# Patient Record
Sex: Male | Born: 1970 | Race: White | Hispanic: No | Marital: Single | Smoking: Current every day smoker
Health system: Southern US, Community
[De-identification: ages and names within clinical notes are randomized; demographics above are authoritative.]

## PROBLEM LIST (undated history)

## (undated) DIAGNOSIS — I4891 Unspecified atrial fibrillation: Secondary | ICD-10-CM

## (undated) DIAGNOSIS — R7303 Prediabetes: Secondary | ICD-10-CM

## (undated) DIAGNOSIS — J449 Chronic obstructive pulmonary disease, unspecified: Secondary | ICD-10-CM

## (undated) DIAGNOSIS — I10 Essential (primary) hypertension: Secondary | ICD-10-CM

## (undated) HISTORY — DX: Essential (primary) hypertension: I10

## (undated) HISTORY — DX: Prediabetes: R73.03

## (undated) HISTORY — PX: THUMB FUSION: SUR636

---

## 1998-02-17 ENCOUNTER — Emergency Department (HOSPITAL_COMMUNITY): Admission: EM | Admit: 1998-02-17 | Discharge: 1998-02-17 | Payer: Self-pay | Admitting: Emergency Medicine

## 1998-02-17 ENCOUNTER — Encounter: Payer: Self-pay | Admitting: Emergency Medicine

## 2003-12-14 ENCOUNTER — Emergency Department (HOSPITAL_COMMUNITY): Admission: EM | Admit: 2003-12-14 | Discharge: 2003-12-14 | Payer: Self-pay | Admitting: Emergency Medicine

## 2004-06-04 ENCOUNTER — Emergency Department (HOSPITAL_COMMUNITY): Admission: EM | Admit: 2004-06-04 | Discharge: 2004-06-04 | Payer: Self-pay

## 2004-06-13 ENCOUNTER — Emergency Department (HOSPITAL_COMMUNITY): Admission: EM | Admit: 2004-06-13 | Discharge: 2004-06-13 | Payer: Self-pay | Admitting: Family Medicine

## 2004-08-22 ENCOUNTER — Emergency Department (HOSPITAL_COMMUNITY): Admission: EM | Admit: 2004-08-22 | Discharge: 2004-08-22 | Payer: Self-pay | Admitting: Family Medicine

## 2005-08-31 ENCOUNTER — Ambulatory Visit: Payer: Self-pay | Admitting: Cardiology

## 2005-08-31 ENCOUNTER — Observation Stay (HOSPITAL_COMMUNITY): Admission: EM | Admit: 2005-08-31 | Discharge: 2005-09-01 | Payer: Self-pay | Admitting: Emergency Medicine

## 2006-06-20 ENCOUNTER — Emergency Department (HOSPITAL_COMMUNITY): Admission: EM | Admit: 2006-06-20 | Discharge: 2006-06-20 | Payer: Self-pay | Admitting: Emergency Medicine

## 2007-02-27 ENCOUNTER — Emergency Department (HOSPITAL_COMMUNITY): Admission: EM | Admit: 2007-02-27 | Discharge: 2007-02-27 | Payer: Self-pay | Admitting: Emergency Medicine

## 2007-08-15 ENCOUNTER — Emergency Department (HOSPITAL_COMMUNITY): Admission: EM | Admit: 2007-08-15 | Discharge: 2007-08-15 | Payer: Self-pay | Admitting: Family Medicine

## 2007-08-17 ENCOUNTER — Emergency Department (HOSPITAL_COMMUNITY): Admission: EM | Admit: 2007-08-17 | Discharge: 2007-08-17 | Payer: Self-pay | Admitting: Family Medicine

## 2007-09-19 ENCOUNTER — Emergency Department (HOSPITAL_COMMUNITY): Admission: EM | Admit: 2007-09-19 | Discharge: 2007-09-19 | Payer: Self-pay | Admitting: Emergency Medicine

## 2008-01-02 ENCOUNTER — Emergency Department (HOSPITAL_COMMUNITY): Admission: EM | Admit: 2008-01-02 | Discharge: 2008-01-02 | Payer: Self-pay | Admitting: Family Medicine

## 2008-11-20 ENCOUNTER — Emergency Department (HOSPITAL_COMMUNITY): Admission: EM | Admit: 2008-11-20 | Discharge: 2008-11-20 | Payer: Self-pay | Admitting: Emergency Medicine

## 2008-11-20 ENCOUNTER — Emergency Department (HOSPITAL_COMMUNITY): Admission: EM | Admit: 2008-11-20 | Discharge: 2008-11-20 | Payer: Self-pay | Admitting: Family Medicine

## 2008-11-27 ENCOUNTER — Emergency Department (HOSPITAL_COMMUNITY): Admission: EM | Admit: 2008-11-27 | Discharge: 2008-11-27 | Payer: Self-pay | Admitting: Emergency Medicine

## 2009-09-27 ENCOUNTER — Emergency Department (HOSPITAL_COMMUNITY): Admission: EM | Admit: 2009-09-27 | Discharge: 2009-09-27 | Payer: Self-pay | Admitting: Emergency Medicine

## 2009-09-30 ENCOUNTER — Encounter: Admission: RE | Admit: 2009-09-30 | Discharge: 2009-11-03 | Payer: Self-pay | Admitting: Orthopedic Surgery

## 2009-10-16 ENCOUNTER — Emergency Department (HOSPITAL_COMMUNITY): Admission: EM | Admit: 2009-10-16 | Discharge: 2009-10-16 | Payer: Self-pay | Admitting: Emergency Medicine

## 2009-11-15 ENCOUNTER — Emergency Department (HOSPITAL_COMMUNITY): Admission: EM | Admit: 2009-11-15 | Discharge: 2009-11-15 | Payer: Self-pay | Admitting: Emergency Medicine

## 2009-11-24 ENCOUNTER — Emergency Department (HOSPITAL_COMMUNITY): Admission: EM | Admit: 2009-11-24 | Discharge: 2009-11-24 | Payer: Self-pay | Admitting: Family Medicine

## 2010-05-31 IMAGING — CR DG TIBIA/FIBULA 2V*L*
4 series · 4 of 4 positions shown · non-contrast
Comparison: None

CLINICAL DATA: Erythema, swelling and tenderness of the lower leg.

LEFT TIBIA AND FIBULA - 2 VIEW

[t tib/fib ap left (1 of 2)]
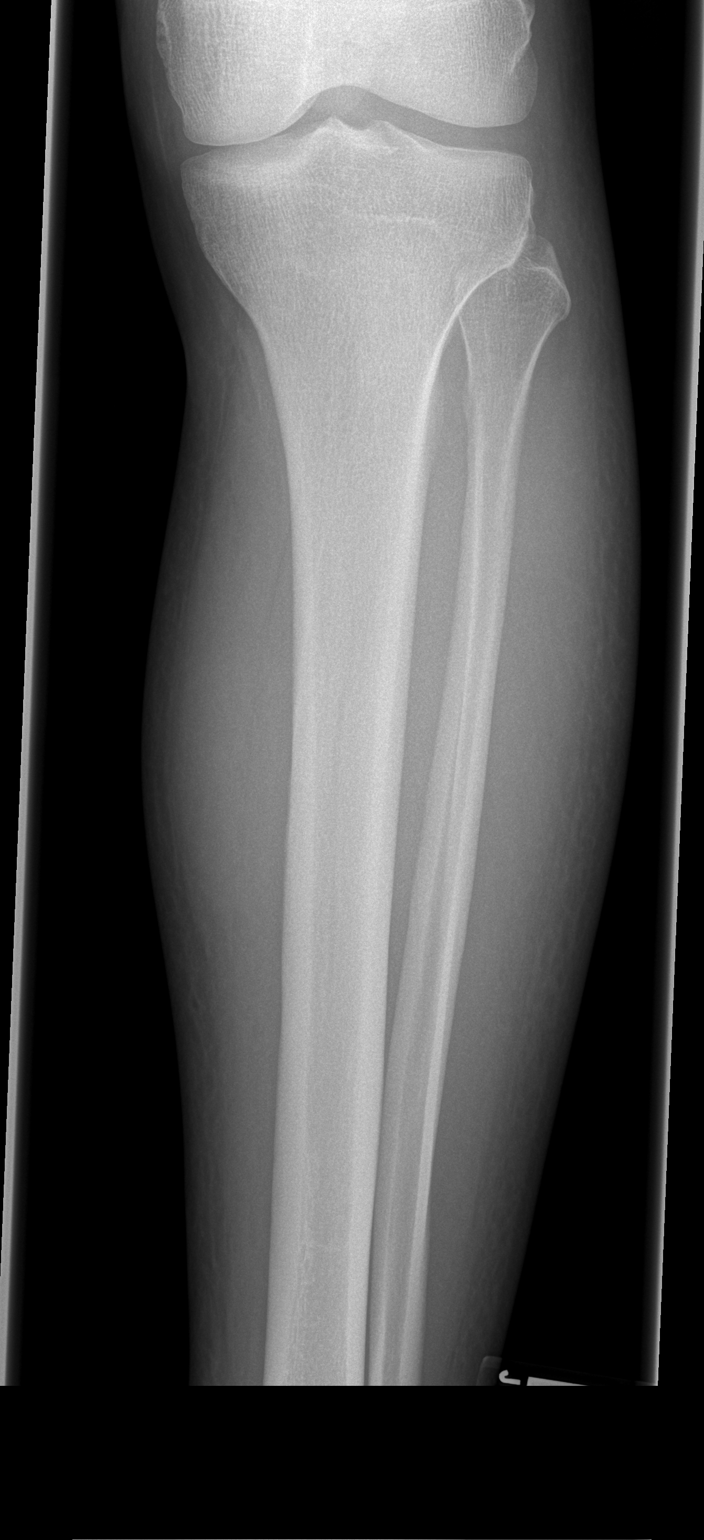

[t tib/fib ap left (2 of 2)]
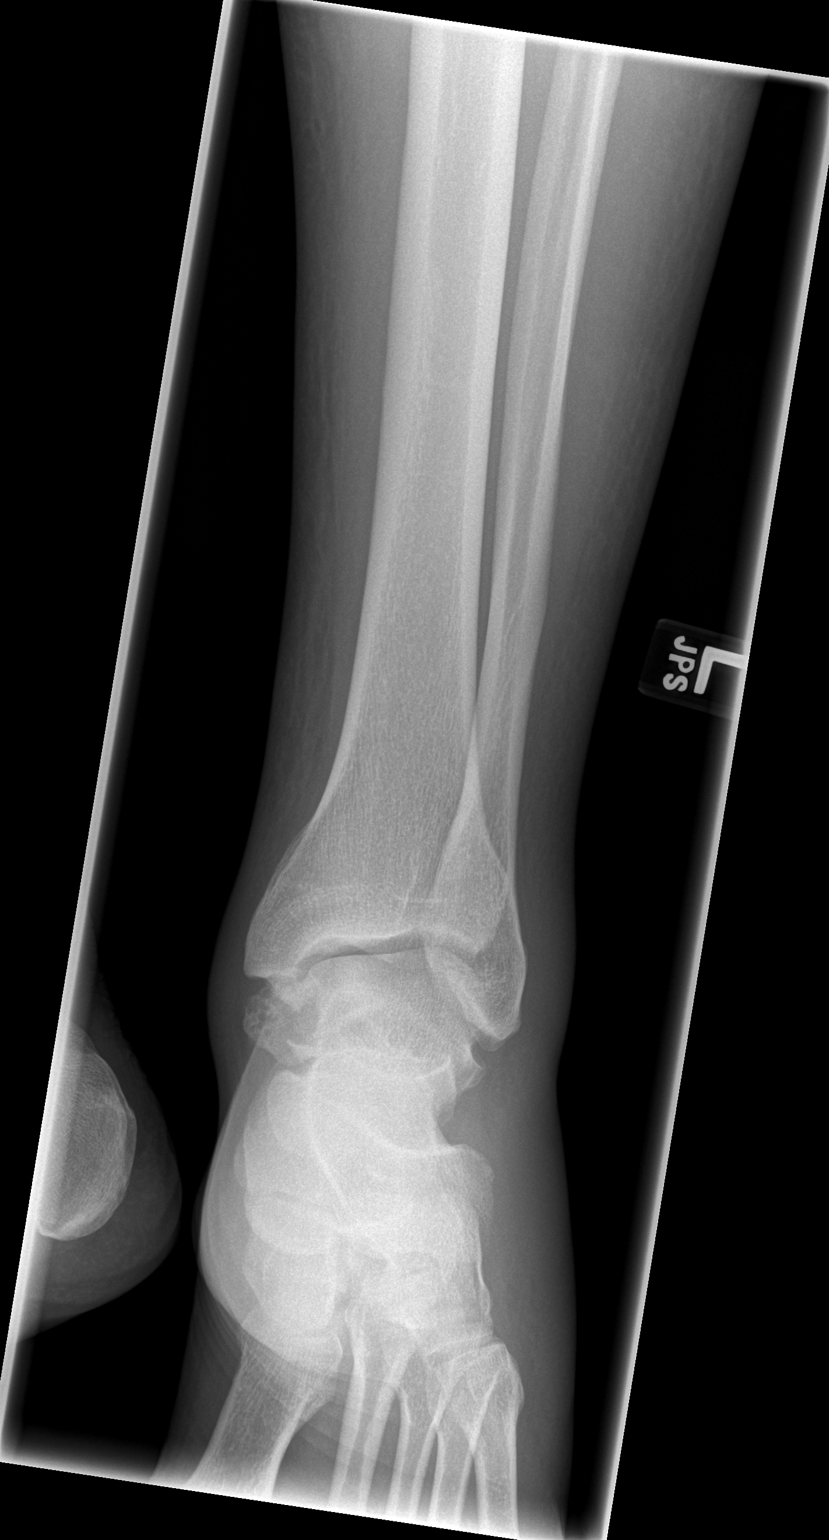

[t tib/fib lat left (1 of 2)]
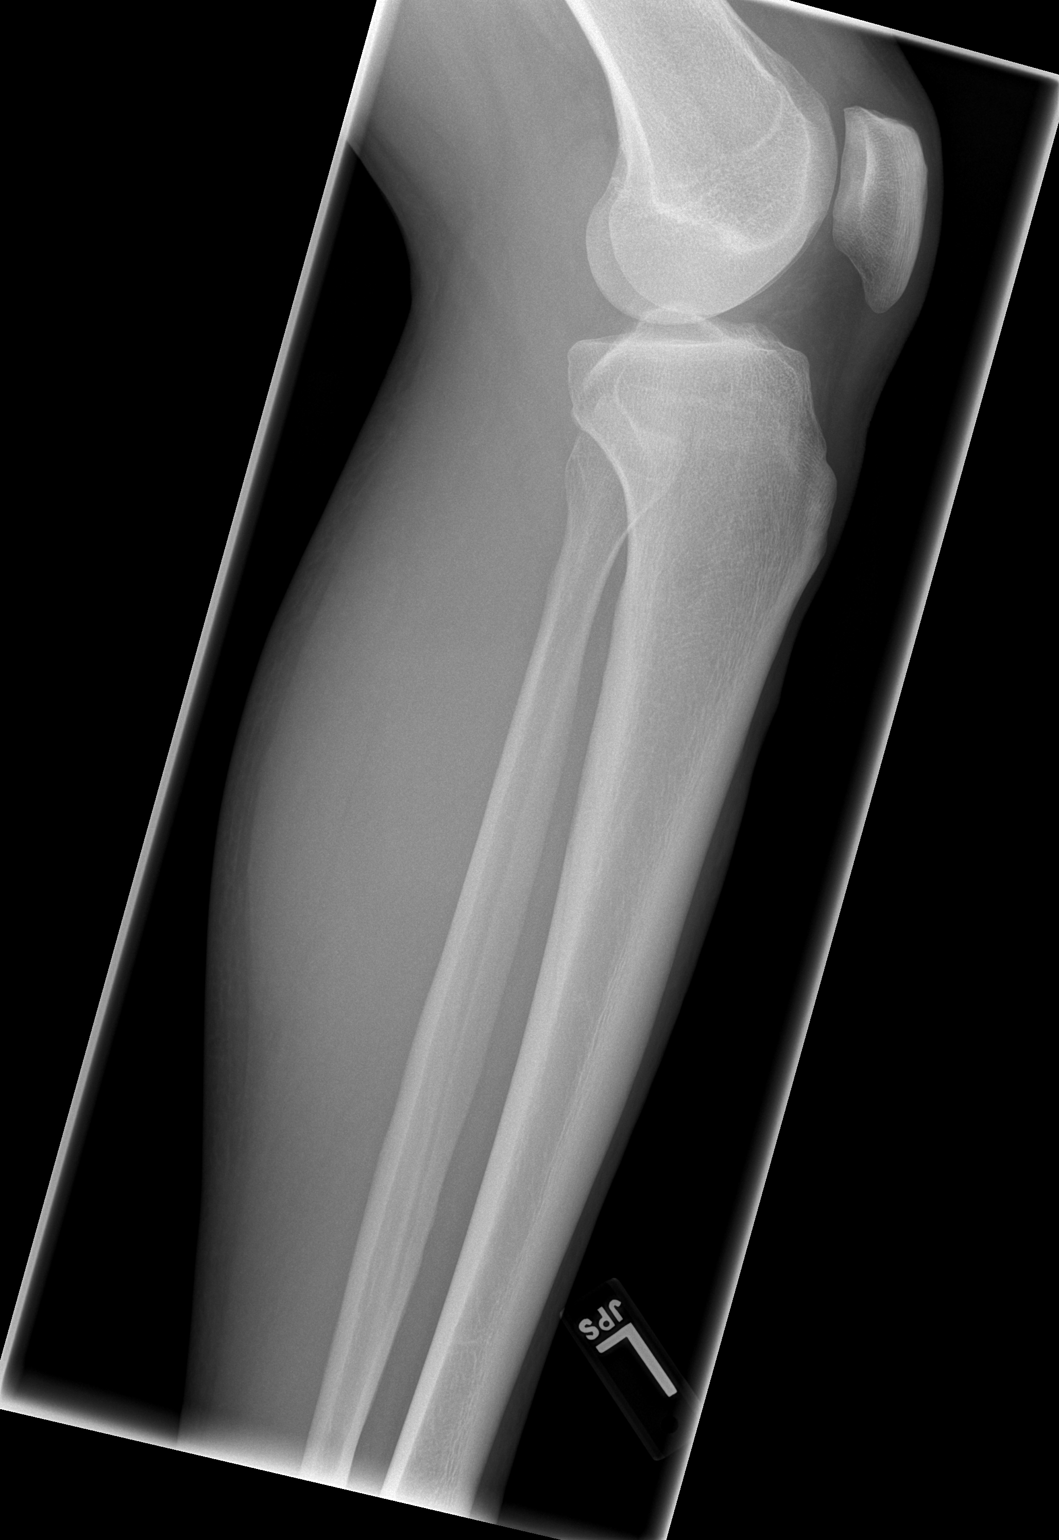

[t tib/fib lat left (2 of 2)]
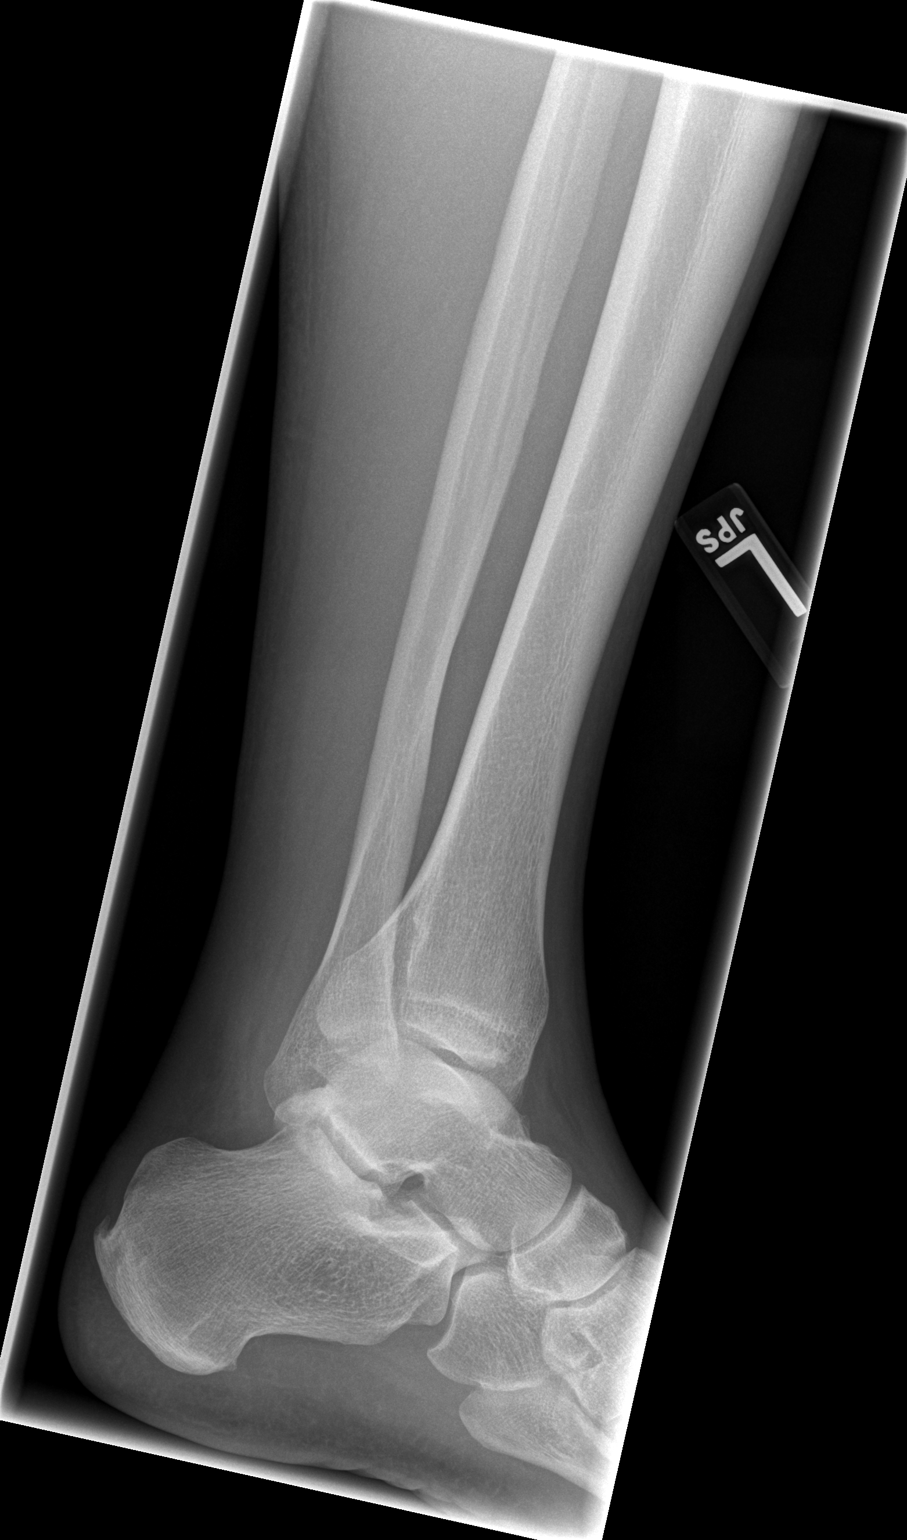

[4 of 4 positions shown; findings below may reference images not displayed]

FINDINGS: The soft tissues are diffusely prominent inferiorly.  No
acute fracture, dislocation or bone destruction is identified.  On
the AP view of the inferior lower leg, an ossific density
projecting inferior to the medial malleolus is probably due to
visualization of the posterior subtalar joint en face.  There is a
small dorsal calcaneal spur.
IMPRESSION: Distal soft tissue swelling compatible with cellulitis.  Correlate
clinically.  No acute osseous findings.

## 2010-09-07 LAB — DIFFERENTIAL
Basophils Absolute: 0.1 10*3/uL (ref 0.0–0.1)
Eosinophils Relative: 3 % (ref 0–5)
Lymphocytes Relative: 25 % (ref 12–46)
Lymphs Abs: 2.5 10*3/uL (ref 0.7–4.0)
Monocytes Absolute: 0.8 10*3/uL (ref 0.1–1.0)
Monocytes Relative: 8 % (ref 3–12)
Neutro Abs: 6.5 10*3/uL (ref 1.7–7.7)
Neutrophils Relative %: 64 % (ref 43–77)

## 2010-09-07 LAB — CBC
Hemoglobin: 15.3 g/dL (ref 13.0–17.0)
Platelets: 272 10*3/uL (ref 150–400)
RBC: 4.75 MIL/uL (ref 4.22–5.81)

## 2010-09-07 LAB — BASIC METABOLIC PANEL
Chloride: 109 mEq/L (ref 96–112)
Creatinine, Ser: 0.79 mg/dL (ref 0.4–1.5)
GFR calc Af Amer: >60 mL/min (ref >60)
Glucose, Bld: 104 mg/dL — ABNORMAL HIGH (ref 70–99)
Sodium: 138 mEq/L (ref 135–145)

## 2010-09-26 LAB — DIFFERENTIAL
Basophils Relative: 1 % (ref 0–1)
Eosinophils Relative: 1 % (ref 0–5)
Eosinophils Relative: 1 % (ref 0–5)
Lymphocytes Relative: 15 % (ref 12–46)
Monocytes Absolute: 1.5 10*3/uL — ABNORMAL HIGH (ref 0.1–1.0)
Monocytes Absolute: 1.7 10*3/uL — ABNORMAL HIGH (ref 0.1–1.0)
Monocytes Relative: 10 % (ref 3–12)
Neutrophils Relative %: 72 % (ref 43–77)
Neutrophils Relative %: 75 % (ref 43–77)

## 2010-09-26 LAB — CBC
HCT: 43.3 % (ref 39.0–52.0)
HCT: 44.8 % (ref 39.0–52.0)
MCHC: 34.5 g/dL (ref 30.0–36.0)
MCHC: 34.7 g/dL (ref 30.0–36.0)
Platelets: 252 10*3/uL (ref 150–400)
WBC: 15.3 10*3/uL — ABNORMAL HIGH (ref 4.0–10.5)
WBC: 15.6 10*3/uL — ABNORMAL HIGH (ref 4.0–10.5)

## 2010-09-26 LAB — BASIC METABOLIC PANEL
Calcium: 9.1 mg/dL (ref 8.4–10.5)
Creatinine, Ser: 0.8 mg/dL (ref 0.4–1.5)
GFR calc non Af Amer: >60 mL/min (ref >60)
Sodium: 133 mEq/L — ABNORMAL LOW (ref 135–145)

## 2010-11-04 NOTE — H&P (Signed)
Kevin Duncan, Kevin Duncan               ACCOUNT NO.:  [id number]   MEDICAL RECORD NO.:  [id number]          PATIENT TYPE:  EMS   LOCATION:  MAJO                         FACILITY:  [facility]   PHYSICIAN:  Jonna L. Robb Matar, M.D.DATE OF BIRTH:  December 23, 1970   DATE OF ADMISSION:  08/30/2005  DATE OF DISCHARGE:                                HISTORY & PHYSICAL   PRIMARY CARE PHYSICIAN:  Unassigned.   CHIEF COMPLAINT:  Chest pain.   HISTORY:  This 39 year old white male was watching TV around 7:30 p.m. and  started to develop sharp, very painful retrosternal chest pressure.  Lasted  intermittently for a couple of hours and he finally called EMS and was given  three nitroglycerin.  Finally had to get some Toradol and morphine in the  emergency room to have it subside.  He had no nausea, shortness of breath,  diaphoresis, or dizziness.  The chest pain did not radiate.   PAST MEDICAL HISTORY:  Had several broken bones.  Has been quite a  daredevil.   OPERATION:  Right thumb surgery.   FAMILY HISTORY:  Brother died of some kind of heart disease.  His father has  had a triple bypass at age 59.  His mother is diabetic.   SOCIAL HISTORY:  He is married.  He has four or five children.  There is  some question about the paternity on one child that looks like him.  He  smokes one to one and a half packs per day.  Drinks a six-pack every  evening.  Works in [employer].  Smokes occasional pot.   ALLERGIES:  None.   MEDICATIONS:  None.   REVIEW OF SYSTEMS:  12 systems were reviewed.  Patient has had no nausea,  vomiting, wheezing that he was aware of, fever, shortness of breath.  He has  had a bad tooth.  He had a hacky cough that occasionally brings up a little  bit of brownish sputum.  The rest of the review is negative.   PHYSICAL EXAMINATION:  VITAL SIGNS:  Temperature 97.5, pulse 65,  respirations 23, blood pressure 125/72.  GENERAL:  He is a well-developed muscular white male with  tattoos.  HEENT:  Conjunctivae and lids are normal.  Pupils reactive.  Extraocular  movements were full.  Has noticeable earlobe crease.  NECK:  No mass, thyromegaly, or carotid bruits.  RESPIRATORY:  Effort seems normal.  The lungs have some scattered expiratory  wheeze, especially at the left upper chest.  No dullness.  HEART:  Regular rate and rhythm.  Normal S1 and S2 without murmurs, rubs, or  gallops.  Pulses without bruits.  There is no clubbing, cyanosis, edema.  ABDOMEN:  Nontender.  Normal bowel sounds.  No hepatosplenomegaly or  hernias.  There is no cervical or inguinal adenopathy.  EXTREMITIES:  Muscle strength is 5/5 with full range of motion all four  extremities.  SKIN:  No rash, lesions, or nodules.  NEUROLOGIC:  Cranial nerves are intact.  DTRs are 2+ and equal.  Sensation  is normal.  Patient is alert and oriented x3.  Normal  memory, judgment, and  affect.   LABORATORIES:  EKG shows a sinus rhythm and a high J-point.  Drugs of abuse  is positive for opiates, benzos, and THC, but not for cocaine.  Hemoglobin  16.3, hematocrit 48.  D-dimer less than 0.22.  BUN 13, creatinine 1.  Chest  x-ray shows some minimal bronchitic changes.   IMPRESSION:  1.  Chest pain.  The patient has ruled out.  I will put him in a little bit      of Lovenox and get cardiac stress testing in the morning.  2.  Acute on possibly chronic bronchitis.  I am going to give him some      Ceftin for the bronchitis which should also help his bad tooth.  3.  Tobacco abuse.  I strongly suggest he quit smoking.  I will put him on a      nicotine patch.  4.  Alcohol abuse.  He does drink alcohol daily.  I will leave Ativan      ordered just in case he develops any withdrawals but he has no history      of that.      Jonna L. Robb Matar, M.D.  Electronically Signed     JLB/MEDQ  D:  08/31/2005  T:  08/31/2005  Job:  [id number]

## 2010-11-04 NOTE — Consult Note (Signed)
NAMEGRANTLEY, SAVAGE NO.:  [id number]   MEDICAL RECORD NO.:  [id number]          PATIENT TYPE:  INP   LOCATION:  6527                         FACILITY:  [facility]   PHYSICIAN:  Willa Rough, M.D.     DATE OF BIRTH:  Jan 09, 1971   DATE OF CONSULTATION:  08/30/2005  DATE OF DISCHARGE:                                   CONSULTATION   Patient does not have a primary care physician.   Mr. Kevin Duncan is a 40 year old white male who presented to [facility] via [facility] secondary to chest  discomfort.   He states that at approximately 7 p.m. yesterday evening while laying on the  couch watching TV, he suddenly developed anterior chest aching sensation.  This did not radiate nor was it associated with shortness of breath, nausea,  vomiting or diaphoresis.  He gave it an 8 on a scale of 0 to 10.  He changed  positions multiple times on the couch trying to get comfortable and took  some ibuprofen without relief.  Due to the continued symptoms, his wife  called EMS.  During transport, he received baby aspirin and sublingual  nitroglycerin with little change in the discomfort. In the emergency room  after receiving morphine, his discomfort eased.  Since his admission, he has  continued to notice this same chest aching sensation whenever he is awake.  He is not quite sure the last time it was an actual 0 unless he was  sleeping.  He denies any pleuritic component, recent injuries, accidents,  prior occurrences or tenderness.   ALLERGIES:  NO KNOWN DRUG ALLERGIES.   MEDICATIONS:  No prescription medications.  Over-the-counter medications  include ibuprofen and Goody powders.   He has not seen a physician in unknown duration.  He denies any history of  diabetes, myocardial infarction, CVA, COPD, bleeding dyscrasias, thyroid  dysfunction and he does not know his cholesterol status.  He has had surgery  on his right thumb.   SOCIAL HISTORY:   He resides in [place], [place], with his wife.  He has three sons, no grandchildren.  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  He smokes approximately one and a half packs per day and has been  doing so for at least 15 years.  He drinks a six-pack of beer per day.  He  smokes pot, last use was this past weekend.  He denies any herbal  medications, specific diet or exercise program.   FAMILY HISTORY:  His mother is alive at the age of 60 with a history of  diabetes and COPD.  His father, age 36, has undergone three vessel bypass  surgery, also has a history of COPD.  His brother, 6, is deceased with  sudden death.  He does not have any sisters.   REVIEW OF SYSTEMS:  In addition to above, is notable for occasional  headaches.  He has multiple tattoos on his check, back and arms.  Positive  snoring, possible obstructive sleep apnea per the wife.   PHYSICAL EXAMINATION:  VITAL  SIGNS:  Temperature 97.9, blood pressure  122/78, pulse 64, respirations 20, 95% sat on room air.  HEENT:  Unremarkable.  NECK:  Supple without thyromegaly, adenopathy, JVD, or carotid bruits.  CHEST:  Symmetrical excursion, clear to auscultation without wheezing,  rhonchi or rales.  CARDIOVASCULAR:  PMI is not displaced, regular rate and rhythm.  I do not  appreciate any murmurs, rubs, gallops, or clicks.  Peripheral pulses are  symmetrical and intact without abdominal or femoral bruits.  SKIN:  Integument was intact with two tattoos anterior chest, two tattoos on  his posterior, two tattoos on his back and one tattoo on his right arm.  ABDOMEN:  Bowel sounds present without organomegaly, masses, or tenderness.  EXTREMITIES:  Negative clubbing, cyanosis, or edema.  MUSCULOSKELETAL:  No deformities noted.  Discomfort is not reproduced with  palpation or full range of motion examination with upper extremity.  NEUROLOGIC:  Grossly intact.   Chest x-ray shows minimal bronchitic changes.   EKG shows  normal sinus rhythm, normal axis, normal intervals, some PACs,  early repolarization, no old EKGs are available for comparison.   D-dimer was less than 0.22.  H&H 16.3 and 48.  Sodium 137, potassium 3.6,  BUN 13, creatinine 1, glucose 96.  One CK-MB and troponin are negative for  myocardial infarction.  Urine drug screen was positive for benzodiazepines,  opiates and THC.   IMPRESSION:  1.  Prolonged atypical chest discomfort.  2.  Tobacco use.  3.  Early family history.  4.  Drug and alcohol use.   PLAN:  Dr. Myrtis Ser reviewed the patient's history, smoke with and examined the  patient and agrees with the above.  At this point, there is no proof of  cardiac ischemia but given his risk factors and early family history, agreed  with stress Myoview today.  Prior to stress Myoview, we will order a second  CK-MB and troponin and confirm that it is negative before he walks on the  treadmill.  We will also order tobacco cessation consult and fasting lipids  and LFTs.  We have suggested that he obtain a primary care physician and  pursue cardiac risk factor modifications.      Joellyn Rued, P.A. [facility]    ______________________________  Willa Rough, M.D.    EW/MEDQ  D:  08/31/2005  T:  09/01/2005  Job:  [id number]

## 2010-11-04 NOTE — Discharge Summary (Signed)
Kevin Duncan, [name]               ACCOUNT NO.:  [id number]   MEDICAL RECORD NO.:  [id number]          PATIENT TYPE:  OBV   LOCATION:  6533                         FACILITY:  [facility]   PHYSICIAN:  Kevin Duncan, M.D.    DATE OF BIRTH:  Nov 27, 1970   DATE OF ADMISSION:  08/30/2005  DATE OF DISCHARGE:  09/01/2005                                 DISCHARGE SUMMARY   DISCHARGE DIAGNOSES:  1.  Chest pain, myocardial infarction ruled out.  2.  Mild left ventricular systolic dysfunction with an ejection fraction of      50% per cardiac catheterization and 44% per stress Myoview.  3.  Hyperlipidemia.  4.  Tobacco use.  5.  Probable bronchitis.  6.  Anxiety.   DISCHARGE MEDICATIONS:  1.  Lipitor 40 milligrams half a tablet daily.  2.  Nicotine patch 21 milligrams, change patch daily (do not smoke with the      patch on).  3.  Ativan 0.5 milligrams 1 tablet to 2 tablets every 8-12 hours p.r.n. for      anxiety.   CONSULTATIONS:  1.  Dr. Willa Duncan.  2.  Dr. Charlies Duncan.   PROCEDURES PERFORMED:  1.  Stress Myoview on [phone]. The results revealed probable inferior      thinning but no ischemia. Ejection fraction estimated at 44% but      visually appeared better and wall motion appeared to be normal.  2.  Cardiac catheterization by Dr. Juanda Chance on [phone]. The results      revealed normal angiography and left ventricular wall motion.      Questionable mild global hypokinesis, ejection fraction estimated at      50%.   HISTORY OF PRESENT ILLNESS:  The patient is a 40 year old man, with no  significant past medical history, who presented to the emergency department  on March14,2007 with a chief complaint of sharp retrosternal chest pressure.  He called EMS and was given nitroglycerin. His pain subsided only a little.  When he presented to the emergency department, he was given Toradol and  morphine which all but resolved the pain. There was no associated nausea,  shortness of  breath, diaphoresis or dizziness. However, because of his  family history significant for coronary artery disease, the patient was  admitted for further evaluation and management.   HOSPITAL COURSE:  Problem 1: CHEST PAIN/MILD LEFT VENTRICULAR SYSTOLIC  DYSFUNCTION/HYPERLIPIDEMIA: The patient was started on prophylactic Lovenox  and p.r.n. sublingual nitroglycerin. Aspirin therapy was started as well.  The patient's pain was treated with as needed morphine, as needed Tylenol,  and as needed oxycodone. Ativan was also ordered as needed for anxiety. The  patient continued to complain of chest pain and was therefore started on a  nitroglycerin drip. On admission, his cardiac markers were negative. His EKG  revealed sinus rhythm with evidence of sinus arrhythmia and with a heart  rate of 60 beats per minute.   Further lab data were collected and the results are as follows: homocystine  10.6, total cholesterol of 232, triglycerides of 165, HDL cholesterol of 30,  and LDL  cholesterol of 169. The patient was started on Zocor 40 milligrams  q.h.s. His TSH was within normal limits at 0.863. His urine drug screen was  positive for opiates and THC. His D-dimer was less than 0.22.   Cardiologist, Dr. Willa Duncan, was consulted for further evaluation and  management. Per his recommendations, the patient underwent a Cardiolite  stress test/stress Myoview. The stress Myoview revealed an ejection fraction  of 44% and probable inferior thinning but no obvious ischemia. Because of  this finding, the patient underwent  further with a cardiac catheterization.  Dr. Charlies Duncan performed the cardiac catheterization on March15,2007 and  in essence it revealed normal coronary arteries. However, there was evidence  of mild global hypokinesis with an ejection fraction estimated at 50%.   The patient became chest painfree prior to hospital discharge.  The patient  may need an outpatient echocardiogram. This  will be decided upon by the  cardiology team at [facility]. The patient was advised to follow up  with his new primary care physician at the [facility].   Problem 2: TOBACCO USE/MARIJUANA: The patient was admonished to stop.   Problem 3: PROBABLE BRONCHITIS: The patient has a significant history of  tobacco use. The patient's lung exam by Dr. Robb Matar revealed audible  wheezes and crackles. His chest x-ray revealed minimal bronchitic changes  but no acute active disease. The patient was started on Ceftin empirically  and treated with albuterol and Atrovent nebulizers as well. The patient was  again advised to stop smoking. His lungs were clear at the time of hospital  discharge.   Problem 4: ANXIETY: The patient experienced several episodes of anxiousness  during the hospital course. He was treated with as needed Ativan. Following  treatment with Ativan, his anxiety subsided.   DISCHARGE DISPOSITION:  The patient was discharged to home in improved and  stable condition on March17,2007. He was advised to follow up with his new  primary physician at [facility] in the next 5-7 days.      Kevin Duncan, M.D.  Electronically Signed     DF/MEDQ  D:  09/04/2005  T:  09/05/2005  Job:  [id number]   cc:   Kevin Duncan, M.D.  [address]  [address]  [place]  [place] [address]   Kevin Duncan, M.D. [facility]  [address]  [address]  [place]  [place] [address]

## 2010-11-04 NOTE — Cardiovascular Report (Signed)
Kevin Duncan, Kevin Duncan               ACCOUNT NO.:  [id number]   MEDICAL RECORD NO.:  [id number]          PATIENT TYPE:  INP   LOCATION:  6533                         FACILITY:  [facility]   PHYSICIAN:  Charlies Constable, M.D. [facility] DATE OF BIRTH:  1971/05/31   DATE OF PROCEDURE:  08/31/2005  DATE OF DISCHARGE:  09/01/2005                              CARDIAC CATHETERIZATION   PROCEDURE PERFORMED:  Cardiac catheterization.   CLINICAL HISTORY:  [name] is 40 years old and has a strong family  history of coronary disease with a father, who had bypass, and a brother,  who has coronary disease.  He was admitted with a prolonged episode of  substernal chest pain.  His cardiac markers were negative.  He had a Myoview  scan, which was borderline with some suggestion of possible inferior  attenuation versus ischemia.  His ejection fraction was 44% by Myoview.  Dr.  Myrtis Ser recommended evaluation with angiography.   PROCEDURE:  The procedure was performed via the right femoral artery using  arterial sheath and 6-French pre-formed coronary catheters.  A frontal  arterial puncture was performed and Omnipaque contrast was used.  The right  femoral artery was closed with Angioseal at the end of the procedure.  The  patient tolerated the procedure well and left the laboratory in satisfactory  condition.   RESULTS:  The aortic pressure was 136/82 with a mean of 109, and the left  ventricular pressure was 136/7.   LEFT MAIN CORONARY ARTERY:  The left main coronary artery was free of  significant disease.   LEFT ANTERIOR DESCENDING ARTERY:  The left anterior descending artery gave  rise to 2 diagonal branches and 3 septal perforators.  These and the LAD  proper were free of significant disease.   CIRCUMFLEX ARTERY:  The circumflex artery gave rise to a ramus branch, an  atrial branch, and 2 posterolateral branches.  These vessels were free of  significant disease.   RIGHT CORONARY ARTERY:  The right  coronary artery was a moderate sized  vessel and gave rise to a conus branch, a right ventricular branch, a  posterior descending branch, and 2 posterolateral branches.  These vessels  were free of significant disease.   LEFT VENTRICULOGRAM:  The left ventriculogram performed in the RAO  projection showed good wall motion with no areas of hypokinesis.  The  estimated ejection fraction was 50%.  The ventricle was slightly pointed,  and there was some questionable mild global hypokinesis, but this was not  clearly abnormal.   CONCLUSION:  Normal angiography and left ventricular wall motion.   RECOMMENDATIONS:  Reassurance.  In view of these findings, I think it is not  likely the patient's symptoms are cardiac in etiology.  He also has a  negative D-dimer so pulmonary embolism is very unlikely.  He does not have  many symptoms of reflux, but it is still possible that this could be reflux  with spasm and a trial of H2 blockers or PPIs might be considered.  He  should be okay for discharge from the cardiac standpoint later today.  ______________________________  Charlies Constable, M.D. [facility]     BB/MEDQ  D:  09/01/2005  T:  09/02/2005  Job:  [id number]   cc:   Willa Rough, M.D.  [address]  [address]  [place]  [place] [address]   Cardiopulmonary Laboratory   Elliot Cousin, M.D.   [facility]

## 2011-03-10 LAB — CULTURE, ROUTINE-ABSCESS

## 2011-05-28 ENCOUNTER — Emergency Department (HOSPITAL_COMMUNITY)
Admission: EM | Admit: 2011-05-28 | Discharge: 2011-05-28 | Disposition: A | Discharge status: Home / Self Care | Payer: Self-pay | Source: Home / Self Care

## 2011-05-28 ENCOUNTER — Encounter: Payer: Self-pay | Admitting: *Deleted

## 2011-05-28 ENCOUNTER — Emergency Department (INDEPENDENT_AMBULATORY_CARE_PROVIDER_SITE_OTHER): Payer: Self-pay

## 2011-05-28 DIAGNOSIS — S93409A Sprain of unspecified ligament of unspecified ankle, initial encounter: Secondary | ICD-10-CM

## 2011-05-28 DIAGNOSIS — S93402A Sprain of unspecified ligament of left ankle, initial encounter: Secondary | ICD-10-CM

## 2011-05-28 MED ORDER — HYDROCODONE-ACETAMINOPHEN 5-325 MG PO TABS: 2.0000 | ORAL_TABLET | ORAL | Status: AC | PRN

## 2011-05-28 MED ORDER — IBUPROFEN 800 MG PO TABS: 800.0000 mg | ORAL_TABLET | Freq: Three times a day (TID) | ORAL | Status: AC

## 2011-05-28 NOTE — ED Provider Notes (Signed)
History     CSN: [id number] Arrival date & time: 05/28/2011  6:18 PM   None     Chief Complaint  Patient presents with  . Ankle Pain    left ankle injury noon today playing basket ball twisted ankle bruising swelling and increased pain throughout day     (Consider location/radiation/quality/duration/timing/severity/associated sxs/prior treatment) Patient is a 40 y.o. male presenting with ankle pain. The history is provided by the patient.  Ankle Pain  The incident occurred 6 to 12 hours ago. Incident location: playing basketball. Injury mechanism: foot inversion. The pain is present in the left ankle and left foot. The quality of the pain is described as throbbing and sharp. The pain is at a severity of 8/10. The pain is severe. The pain has been constant since onset. Associated symptoms include inability to bear weight and loss of motion. Pertinent negatives include no numbness, no loss of sensation and no tingling. The symptoms are aggravated by activity, bearing weight and palpation. He has tried nothing for the symptoms.    History reviewed. No pertinent past medical history.  Past Surgical History  Procedure Date  . Thumb fusion     History reviewed. No pertinent family history.  History  Substance Use Topics  . Smoking status: Current Everyday Smoker  . Smokeless tobacco: Not on file  . Alcohol Use: No      Review of Systems  Constitutional: Negative for fever and chills.  Musculoskeletal: Positive for joint swelling.       Ankle and foot pain  Skin: Positive for color change.  Neurological: Negative for tingling, weakness and numbness.    Allergies  Review of patient's allergies indicates no known allergies.  Home Medications   Current Outpatient Rx  Name Route Sig Dispense Refill  . HYDROCODONE-ACETAMINOPHEN 5-325 MG PO TABS Oral Take 2 tablets by mouth every 4 (four) hours as needed for pain. 10 tablet 0  . IBUPROFEN 800 MG PO TABS Oral Take 1 tablet  (800 mg total) by mouth 3 (three) times daily. 21 tablet 0    BP 151/96  Pulse 97  Temp(Src) 98 F (36.7 C) (Oral)  Resp 18  SpO2 97%  Physical Exam  Constitutional: He appears well-developed and well-nourished. No distress.  Pulmonary/Chest: Effort normal.  Musculoskeletal:       Left ankle: He exhibits decreased range of motion, swelling and ecchymosis. He exhibits no deformity and normal pulse. tenderness. Lateral malleolus tenderness found.       Left foot: He exhibits tenderness and swelling. He exhibits normal range of motion.  Neurological: No sensory deficit.  Skin: Skin is warm and dry. Bruising noted.       ED Course  Procedures (including critical care time)  Labs Reviewed - No data to display Dg Ankle Complete Left  05/28/2011  *RADIOLOGY REPORT*  Clinical Data: Injury, pain and swelling.  LEFT ANKLE COMPLETE - 3+ VIEW  Comparison: None.  Findings: There is soft tissue swelling about the ankle eccentric to the lateral side.  No fracture or dislocation is identified.  No tibiotalar joint effusion.  IMPRESSION: Soft tissue swelling without underlying fracture.  Original Report Authenticated By: Bernadene Bell. D'ALESSIO, M.D.   Dg Foot Complete Left  05/28/2011  *RADIOLOGY REPORT*  Clinical Data: Fall with injury.  Pain.  LEFT FOOT - COMPLETE 3+ VIEW  Comparison: None.  Findings: There is no evidence for an acute fracture.  Bony alignment is anatomic. No worrisome lytic or sclerotic osseous lesion.  IMPRESSION:  No acute findings  Original Report Authenticated By: ERIC A. MANSELL, M.D.     1. Left ankle sprain       MDM          Cathlyn Parsons, NP 05/28/11 2131

## 2011-05-28 NOTE — ED Notes (Signed)
Ankle injury left today at noon playing basketball twisted ankle  - elevated iced today now with increased pain swelling and bruising

## 2011-05-29 NOTE — ED Provider Notes (Signed)
Medical screening examination/treatment/procedure(s) were performed by non-physician practitioner and as supervising physician I was immediately available for consultation/collaboration.  Shron Ozer   Adalynne Steffensmeier, MD 05/29/11 1126 

## 2011-06-02 ENCOUNTER — Telehealth (HOSPITAL_COMMUNITY): Payer: Self-pay | Admitting: *Deleted

## 2013-01-25 ENCOUNTER — Encounter (HOSPITAL_COMMUNITY): Payer: Self-pay

## 2013-01-25 ENCOUNTER — Emergency Department (HOSPITAL_COMMUNITY)
Admission: EM | Admit: 2013-01-25 | Discharge: 2013-01-25 | Disposition: A | Discharge status: Home / Self Care | Payer: Medicaid Other | Attending: Emergency Medicine | Admitting: Emergency Medicine

## 2013-01-25 ENCOUNTER — Emergency Department (HOSPITAL_COMMUNITY): Payer: Medicaid Other

## 2013-01-25 DIAGNOSIS — M545 Low back pain, unspecified: Secondary | ICD-10-CM | POA: Insufficient documentation

## 2013-01-25 DIAGNOSIS — Y929 Unspecified place or not applicable: Secondary | ICD-10-CM | POA: Insufficient documentation

## 2013-01-25 DIAGNOSIS — R51 Headache: Secondary | ICD-10-CM | POA: Insufficient documentation

## 2013-01-25 DIAGNOSIS — IMO0002 Reserved for concepts with insufficient information to code with codable children: Secondary | ICD-10-CM

## 2013-01-25 DIAGNOSIS — Y9389 Activity, other specified: Secondary | ICD-10-CM | POA: Insufficient documentation

## 2013-01-25 DIAGNOSIS — F17200 Nicotine dependence, unspecified, uncomplicated: Secondary | ICD-10-CM | POA: Insufficient documentation

## 2013-01-25 DIAGNOSIS — W1809XA Striking against other object with subsequent fall, initial encounter: Secondary | ICD-10-CM | POA: Insufficient documentation

## 2013-01-25 DIAGNOSIS — W19XXXA Unspecified fall, initial encounter: Secondary | ICD-10-CM

## 2013-01-25 MED ORDER — KETOROLAC TROMETHAMINE 30 MG/ML IJ SOLN
30.0000 mg | Freq: Once | INTRAMUSCULAR | Status: DC
  Filled 2013-01-25: qty 1

## 2013-01-25 MED ORDER — KETOROLAC TROMETHAMINE 30 MG/ML IJ SOLN
30.0000 mg | Freq: Once | INTRAMUSCULAR | Status: AC
  Administered 2013-01-25: 30 mg via INTRAVENOUS

## 2013-01-25 NOTE — ED Provider Notes (Signed)
Medical screening examination/treatment/procedure(s) were performed by non-physician practitioner and as supervising physician I was immediately available for consultation/collaboration.  Jones Skene, M.D.     Jones Skene, MD 01/25/13 0710

## 2013-01-25 NOTE — ED Notes (Addendum)
0138 Pt arrives to the ER via EMS from home and the pt has been drinking and had a fall but pt is unsure on why he fell.  States he has had 9 beers tonight.  C/O pain in L lower back  0230  Scans ordered for the pt and the pt waiting to go for his scans.  0330  Pt is asleep and resting comfortably at this time will continue to monitor  0430  No change in status.  Once pt awakes can remove the C-Collar but as long as he is asleep will nor bother the pt  0530  Pt is asleep at this time no change in status.  Going to allow the pt to sleep until 7 then discharge the pt

## 2013-01-25 NOTE — ED Provider Notes (Signed)
CSN: [id number]     Arrival date & time 01/25/13  0137 History     First MD Initiated Contact with Patient 01/25/13 0142     Chief Complaint  Patient presents with  . Fall   (Consider location/radiation/quality/duration/timing/severity/associated sxs/prior Treatment) HPI Comments: Patient states he had greater than a sixpack of beer a short period of time while he was walking in his home.  He fell or syncopized.  He doesn't remember landing hitting the top of his head on his coffee table.  He was transported by EMS.  He also is complaining of lower back pain  Patient is a 42 y.o. male presenting with fall. The history is provided by the patient.  Fall This is a new problem. The current episode started today. The problem has been unchanged. Associated symptoms include headaches. Pertinent negatives include no abdominal pain, chest pain, chills, fever, nausea or numbness. The symptoms are aggravated by drinking. He has tried immobilization for the symptoms.    History reviewed. No pertinent past medical history. Past Surgical History  Procedure Laterality Date  . Thumb fusion     No family history on file. History  Substance Use Topics  . Smoking status: Current Every Day Smoker  . Smokeless tobacco: Not on file  . Alcohol Use: Yes    Review of Systems  Constitutional: Negative for fever and chills.  Respiratory: Negative for shortness of breath.   Cardiovascular: Negative for chest pain.  Gastrointestinal: Negative for nausea and abdominal pain.  Musculoskeletal: Positive for back pain.  Skin: Negative for wound.  Neurological: Positive for headaches. Negative for dizziness and numbness.  All other systems reviewed and are negative.    Allergies  Hydrocodone  Home Medications  No current outpatient prescriptions on file. BP 122/77  Pulse 89  Temp(Src) 98.7 F (37.1 C) (Oral)  Resp 18  SpO2 98% Physical Exam  Nursing note and vitals reviewed. Constitutional: He  appears well-developed and well-nourished.  HENT:  Head: Normocephalic.  Mouth/Throat: Oropharynx is clear and moist.  Eyes: Pupils are equal, round, and reactive to light.  Neck:  Patient in c-collar  Cardiovascular: Normal rate and regular rhythm.   Pulmonary/Chest: Effort normal and breath sounds normal.  Abdominal: Soft. Bowel sounds are normal.  Musculoskeletal: Normal range of motion. He exhibits tenderness. He exhibits no edema.       Back:  Neurological: He is alert.    ED Course   Procedures (including critical care time)  Labs Reviewed - No data to display Dg Lumbar Spine Complete  01/25/2013   *RADIOLOGY REPORT*  Clinical Data: Status post fall; right lower back pain.  LUMBAR SPINE - COMPLETE 4+ VIEW  Comparison: Lumbar spine radiographs performed 08/22/2004  Findings: There is no evidence of fracture or subluxation. Vertebral bodies demonstrate normal height and alignment. Intervertebral disc space narrowing is noted at L5-S1, with associated sclerotic change.  The visualized neural foramina are grossly unremarkable in appearance.  The visualized bowel gas pattern is unremarkable in appearance; air and stool are noted within the colon.  The sacroiliac joints are within normal limits.  IMPRESSION: No evidence of fracture or subluxation along the lumbar spine.   Original Report Authenticated By: Tonia Ghent, M.D.   Ct Head Wo Contrast  01/25/2013   *RADIOLOGY REPORT*  Clinical Data:  Status post fall; concern for head or cervical spine injury.  CT HEAD WITHOUT CONTRAST AND CT CERVICAL SPINE WITHOUT CONTRAST  Technique:  Multidetector CT imaging of the head and cervical  spine was performed following the standard protocol without intravenous contrast.  Multiplanar CT image reconstructions of the cervical spine were also generated.  Comparison: CT of the head performed 06/04/2004  CT HEAD  Findings: There is no evidence of acute infarction, mass lesion, or intra- or extra-axial  hemorrhage on CT.  The posterior fossa, including the cerebellum, brainstem and fourth ventricle, is within normal limits.  The third and lateral ventricles, and basal ganglia are unremarkable in appearance.  The cerebral hemispheres are symmetric in appearance, with normal gray- white differentiation.  No mass effect or midline shift is seen.  There is no evidence of fracture; visualized osseous structures are unremarkable in appearance.  The orbits are within normal limits. The paranasal sinuses and mastoid air cells are well-aerated.  No significant soft tissue abnormalities are seen.  IMPRESSION: No evidence of traumatic intracranial injury or fracture.  CT CERVICAL SPINE  Findings: There is no evidence of fracture or subluxation. Vertebral bodies demonstrate normal height and alignment.  There is mild narrowing of the intervertebral disc spaces at the lower cervical spine, with scattered small anterior and posterior disc osteophyte complexes noted at multiple levels.  Prevertebral soft tissues are within normal limits.  The visualized neural foramina are grossly unremarkable.  The visualized portions of the thyroid gland are unremarkable in appearance.  No significant soft tissue abnormalities are seen.  IMPRESSION:  1.  No evidence of fracture or subluxation along the cervical spine. 2.  Mild degenerative change at the lower cervical spine.   Original Report Authenticated By: Tonia Ghent, M.D.   Ct Cervical Spine Wo Contrast  01/25/2013   *RADIOLOGY REPORT*  Clinical Data:  Status post fall; concern for head or cervical spine injury.  CT HEAD WITHOUT CONTRAST AND CT CERVICAL SPINE WITHOUT CONTRAST  Technique:  Multidetector CT imaging of the head and cervical spine was performed following the standard protocol without intravenous contrast.  Multiplanar CT image reconstructions of the cervical spine were also generated.  Comparison: CT of the head performed 06/04/2004  CT HEAD  Findings: There is no  evidence of acute infarction, mass lesion, or intra- or extra-axial hemorrhage on CT.  The posterior fossa, including the cerebellum, brainstem and fourth ventricle, is within normal limits.  The third and lateral ventricles, and basal ganglia are unremarkable in appearance.  The cerebral hemispheres are symmetric in appearance, with normal gray- white differentiation.  No mass effect or midline shift is seen.  There is no evidence of fracture; visualized osseous structures are unremarkable in appearance.  The orbits are within normal limits. The paranasal sinuses and mastoid air cells are well-aerated.  No significant soft tissue abnormalities are seen.  IMPRESSION: No evidence of traumatic intracranial injury or fracture.  CT CERVICAL SPINE  Findings: There is no evidence of fracture or subluxation. Vertebral bodies demonstrate normal height and alignment.  There is mild narrowing of the intervertebral disc spaces at the lower cervical spine, with scattered small anterior and posterior disc osteophyte complexes noted at multiple levels.  Prevertebral soft tissues are within normal limits.  The visualized neural foramina are grossly unremarkable.  The visualized portions of the thyroid gland are unremarkable in appearance.  No significant soft tissue abnormalities are seen.  IMPRESSION:  1.  No evidence of fracture or subluxation along the cervical spine. 2.  Mild degenerative change at the lower cervical spine.   Original Report Authenticated By: Tonia Ghent, M.D.   1. Intoxication   2. Fall, initial encounter  MDM  Collar removed patient had no pain with full ROM    Arman Filter, NP 01/25/13 2306

## 2013-01-25 NOTE — ED Provider Notes (Signed)
Pt is intoxicated and fell earlier this AM.  Imaging are unremarkable.  Can be discharge once able to ambulate.    6:52 AM Patient is alert and oriented and clinically sober. Aside from mild low back pain he has no other complaints. Patient is able to ambulate without difficulty. Patient will be able to have family member to pick him up. Patient will be given outpatient resources for alcohol detox he is interested. [facility] therapy discussed.    Fayrene Helper, PA-C 01/25/13 [phone]

## 2013-01-26 NOTE — ED Provider Notes (Signed)
Medical screening examination/treatment/procedure(s) were performed by non-physician practitioner and as supervising physician I was immediately available for consultation/collaboration.  John-Adam Sammye Staff, M.D.     John-Adam Chanel Mckesson, MD 01/26/13 0110 

## 2015-11-09 ENCOUNTER — Encounter (HOSPITAL_COMMUNITY): Payer: Self-pay | Admitting: Emergency Medicine

## 2015-11-09 ENCOUNTER — Emergency Department (HOSPITAL_COMMUNITY)
Admission: EM | Admit: 2015-11-09 | Discharge: 2015-11-09 | Disposition: A | Discharge status: Home / Self Care | Payer: Medicaid Other | Attending: Emergency Medicine | Admitting: Emergency Medicine

## 2015-11-09 ENCOUNTER — Emergency Department (HOSPITAL_COMMUNITY): Payer: Medicaid Other

## 2015-11-09 DIAGNOSIS — I10 Essential (primary) hypertension: Secondary | ICD-10-CM

## 2015-11-09 DIAGNOSIS — F17210 Nicotine dependence, cigarettes, uncomplicated: Secondary | ICD-10-CM | POA: Insufficient documentation

## 2015-11-09 DIAGNOSIS — F1410 Cocaine abuse, uncomplicated: Secondary | ICD-10-CM

## 2015-11-09 DIAGNOSIS — Z7982 Long term (current) use of aspirin: Secondary | ICD-10-CM | POA: Insufficient documentation

## 2015-11-09 DIAGNOSIS — I517 Cardiomegaly: Secondary | ICD-10-CM

## 2015-11-09 DIAGNOSIS — F1310 Sedative, hypnotic or anxiolytic abuse, uncomplicated: Secondary | ICD-10-CM | POA: Insufficient documentation

## 2015-11-09 LAB — CBC
HCT: 50 % (ref 39.0–52.0)
HEMOGLOBIN: 17.3 g/dL — AB (ref 13.0–17.0)
MCH: 34.6 pg — AB (ref 26.0–34.0)
MCHC: 34.6 g/dL (ref 30.0–36.0)
MCV: 100 fL (ref 78.0–100.0)
Platelets: 216 10*3/uL (ref 150–400)
RBC: 5 MIL/uL (ref 4.22–5.81)
RDW: 13.8 % (ref 11.5–15.5)
WBC: 9.2 10*3/uL (ref 4.0–10.5)

## 2015-11-09 LAB — ETHANOL: Alcohol, Ethyl (B): <5 mg/dL (ref <5)

## 2015-11-09 LAB — I-STAT TROPONIN, ED
Troponin i, poc: 0 ng/mL (ref 0.00–0.08)
Troponin i, poc: 0 ng/mL (ref 0.00–0.08)

## 2015-11-09 LAB — RAPID URINE DRUG SCREEN, HOSP PERFORMED
AMPHETAMINES: NOT DETECTED
BARBITURATES: NOT DETECTED
Benzodiazepines: POSITIVE — AB
COCAINE: POSITIVE — AB
OPIATES: NOT DETECTED
TETRAHYDROCANNABINOL: NOT DETECTED

## 2015-11-09 LAB — BASIC METABOLIC PANEL
ANION GAP: 10 (ref 5–15)
BUN: 15 mg/dL (ref 6–20)
CALCIUM: 10.1 mg/dL (ref 8.9–10.3)
CO2: 32 mmol/L (ref 22–32)
Chloride: 95 mmol/L — ABNORMAL LOW (ref 101–111)
Creatinine, Ser: 1.04 mg/dL (ref 0.61–1.24)
GFR calc Af Amer: >60 mL/min (ref >60)
Glucose, Bld: 113 mg/dL — ABNORMAL HIGH (ref 65–99)
POTASSIUM: 4.1 mmol/L (ref 3.5–5.1)
SODIUM: 137 mmol/L (ref 135–145)

## 2015-11-09 LAB — BRAIN NATRIURETIC PEPTIDE: B Natriuretic Peptide: 21.1 pg/mL (ref 0.0–100.0)

## 2015-11-09 MED ORDER — HYDROCHLOROTHIAZIDE 12.5 MG PO CAPS
12.5000 mg | ORAL_CAPSULE | Freq: Once | ORAL | Status: AC
  Administered 2015-11-09: 12.5 mg via ORAL
  Filled 2015-11-09: qty 1

## 2015-11-09 MED ORDER — SODIUM CHLORIDE 0.9 % IV BOLUS (SEPSIS): 1000.0000 mL | Freq: Once | INTRAVENOUS | Status: DC

## 2015-11-09 MED ORDER — IBUPROFEN 400 MG PO TABS
600.0000 mg | ORAL_TABLET | Freq: Once | ORAL | Status: AC
  Administered 2015-11-09: 600 mg via ORAL
  Filled 2015-11-09: qty 1

## 2015-11-09 MED ORDER — HYDROCHLOROTHIAZIDE 12.5 MG PO CAPS: 12.5000 mg | ORAL_CAPSULE | Freq: Every day | ORAL | Status: DC

## 2015-11-09 NOTE — ED Notes (Signed)
MD at bedside. 

## 2015-11-09 NOTE — ED Notes (Signed)
Pt given second turkey sandwich

## 2015-11-09 NOTE — ED Notes (Signed)
Meal given-ok per MD Katrinka BlazingSmith

## 2015-11-09 NOTE — ED Provider Notes (Signed)
CSN: [id number]     Arrival date & time 11/09/15  1334 History   First MD Initiated Contact with Patient 11/09/15 1551     Chief Complaint  Patient presents with  . Chest Pain  . Blurred Vision     (Consider location/radiation/quality/duration/timing/severity/associated sxs/prior Treatment) The history is provided by the patient. No language interpreter was used.   Patient is a 45 year old male, 1.5 pack per day smoker, 5-6 alcohol drinks daily, who presents with 2 weeks of nonspecific symptoms including dizziness, chest pressure and headaches. He reports intermittent dizziness that he describes as lightheadedness and not like the room is spinning. Nothing seems to bring it on or make it better. In addition he has had intermittent episodes of central chest pressure that does not radiate to his arm or his back. There is mild associated shortness of breath. Over the past 3-4 days patient complains of intermittent frontal headaches he describes as throbbing in 8-9 out of 10 at worst. They are relieved by [facility] powder. He also complains of some associated vision changes that he has trouble describing but is cross eyed and that he is not able to read words at a distance that he usually can. Denies double vision or eye pain. Patient's friend as a Lawyer and has been taking his blood pressure during this time. His blood pressure has been running systolics in the 180s. Patient does not have a prior history of high blood pressure. He does not take any medications at home.  History reviewed. No pertinent past medical history. Past Surgical History  Procedure Laterality Date  . Thumb fusion     No family history on file. Social History  Substance Use Topics  . Smoking status: Current Every Day Smoker -- 1.50 packs/day    Types: Cigarettes  . Smokeless tobacco: None  . Alcohol Use: Yes     Comment: 5-6 beer daily    Review of Systems  Constitutional: Negative for fever, chills and diaphoresis.   HENT: Negative for congestion and rhinorrhea.   Eyes: Positive for visual disturbance. Negative for photophobia and pain.  Gastrointestinal: Negative for nausea, vomiting, abdominal pain and constipation.  Genitourinary: Negative for dysuria and difficulty urinating.  Musculoskeletal: Negative for back pain and neck pain.  Skin: Negative for pallor and rash.  Neurological: Positive for light-headedness and headaches. Negative for syncope and numbness.  Psychiatric/Behavioral: Negative for confusion.  All other systems reviewed and are negative.     Allergies  Hydrocodone  Home Medications   Prior to Admission medications   Medication Sig Start Date End Date Taking? Authorizing Provider  aspirin 325 MG tablet Take 325 mg by mouth every 6 (six) hours as needed for mild pain.   Yes Historical Provider, MD  Aspirin-Salicylamide-Caffeine (BC HEADACHE POWDER PO) Take 1 packet by mouth every 6 (six) hours as needed (headache).   Yes Historical Provider, MD   BP 145/102 mmHg  Pulse 97  Temp(Src) 97.7 F (36.5 C) (Oral)  Resp 17  Ht 6\' 1"  (1.854 m)  Wt 89.359 kg  BMI 26.00 kg/m2  SpO2 94% Physical Exam  Constitutional: He is oriented to person, place, and time. He appears well-developed and well-nourished.  HENT:  Head: Normocephalic and atraumatic.  Eyes: EOM are normal. Pupils are equal, round, and reactive to light.  Neck: Normal range of motion. Neck supple.  Cardiovascular: Normal rate, regular rhythm and intact distal pulses.   Pulmonary/Chest: Effort normal and breath sounds normal. No respiratory distress.  Abdominal: Soft. He  exhibits no distension. There is no tenderness.  Musculoskeletal: Normal range of motion. He exhibits no edema or tenderness.  Neurological: He is alert and oriented to person, place, and time.  Skin: Skin is warm and dry. No rash noted.  Psychiatric: He has a normal mood and affect.  Nursing note and vitals reviewed.   ED Course  Procedures  (including critical care time) Labs Review Labs Reviewed  BASIC METABOLIC PANEL - Abnormal; Notable for the following:    Chloride 95 (*)    Glucose, Bld 113 (*)    All other components within normal limits  CBC - Abnormal; Notable for the following:    Hemoglobin 17.3 (*)    MCH 34.6 (*)    All other components within normal limits  URINE RAPID DRUG SCREEN, HOSP PERFORMED - Abnormal; Notable for the following:    Cocaine POSITIVE (*)    Benzodiazepines POSITIVE (*)    All other components within normal limits  BRAIN NATRIURETIC PEPTIDE  ETHANOL  I-STAT TROPOININ, ED  Rosezena Sensor, ED    Imaging Review Dg Chest 2 View  11/09/2015  CLINICAL DATA:  Chest pain EXAM: CHEST  2 VIEW COMPARISON:  01/02/2008 FINDINGS: Cardiomediastinal silhouette is stable. No acute infiltrate or pleural effusion. No pulmonary edema. Bony thorax is unremarkable. IMPRESSION: No active cardiopulmonary disease. Electronically Signed   By: Natasha Mead M.D.   On: 11/09/2015 14:23   I have personally reviewed and evaluated these images and lab results as part of my medical decision-making.   EKG Interpretation   Date/Time:  Tuesday Nov 09 2015 13:38:06 EDT Ventricular Rate:  118 PR Interval:  146 QRS Duration: 78 QT Interval:  344 QTC Calculation: 482 R Axis:   83 Text Interpretation:  Sinus tachycardia Left ventricular hypertrophy  Cannot rule out Septal infarct , age undetermined Abnormal ECG LVH, rate  faster Confirmed by Manus Gunning  MD, STEPHEN [phone]) on 11/09/2015 4:34:00 PM      MDM   Final diagnoses:  Essential hypertension  LVH (left ventricular hypertrophy)  Cocaine abuse   Patient is a previously healthy 45 year old male presents with hypotension and upright of symptoms as mentioned above. Patient also admits to cocaine and Xanax use a few days ago. EKG shows sinus tachycardia with left ventricular hypertrophy. Initially mildly hypertensive with blood pressure 140s/100. Exam is otherwise  unremarkable. CBC CMP are unremarkable. I-STAT troponin is negative. BMP is unremarkable. Alcohol is negative. UDS positive for cocaine and Xanax consistent with patient's story. Delta troponin is undetectable 2. While in the ED patient BP increase his systolics 170/100, HCTZ 12.5mg  given.   On re-evaluation, patient is tolerating by mouth fluids and ambulate without difficulty. He denies dizziness with standing. Laboratory findings do not show any significant abnormalities. Doubt ACS as patient is overall low risk and is undetectable troponins 2. Patient is overall low risk for PE by Wells Criteria. No widened mediastinum and equal pulses on exam; doubt aortic dissection.   Patient's symptoms are likely a combination of drug abuse and long-standing hypertension. Patient was discharged in stable condition. Is to follow-up with a primary care doctor for reevaluation and long-term management of blood pressure. Patient's report of dizziness over the past few weeks, and recent cocaine use, we will defer treating high blood pressure patient has follow-up with primary care doctor. Return precautions were discussed with patient and he is in agreement with this plan.  Seen and discussed with Dr. Manus Gunning, ED attending    Isa Rankin, MD  11/10/15 0255  Glynn OctaveStephen Rancour, MD 11/10/15 [phone]

## 2015-11-09 NOTE — ED Notes (Signed)
Patient states chest pain x 2 weeks with dizziness.   Patient states has been having blurred vision x 2-3 days.   Patient states headache for about a week ago.   Patient denies other symptoms.

## 2015-11-09 NOTE — ED Notes (Signed)
RN made aware of orthostatic VS

## 2015-11-09 NOTE — Discharge Instructions (Signed)
Avoid taking cocaine or other drugs as this may make your blood pressure higher. Follow-up with a primary care doctor for reevaluation of your blood pressure and for medication management.  DASH Eating Plan DASH stands for "Dietary Approaches to Stop Hypertension." The DASH eating plan is a healthy eating plan that has been shown to reduce high blood pressure (hypertension). Additional health benefits may include reducing the risk of type 2 diabetes mellitus, heart disease, and stroke. The DASH eating plan may also help with weight loss. WHAT DO I NEED TO KNOW ABOUT THE DASH EATING PLAN? For the DASH eating plan, you will follow these general guidelines:  Choose foods with a percent daily value for sodium of less than 5% (as listed on the food label).  Use salt-free seasonings or herbs instead of table salt or sea salt.  Check with your health care provider or pharmacist before using salt substitutes.  Eat lower-sodium products, often labeled as "lower sodium" or "no salt added."  Eat fresh foods.  Eat more vegetables, fruits, and low-fat dairy products.  Choose whole grains. Look for the word "whole" as the first word in the ingredient list.  Choose fish and skinless chicken or [place] more often than red meat. Limit fish, poultry, and meat to 6 oz (170 g) each day.  Limit sweets, desserts, sugars, and sugary drinks.  Choose heart-healthy fats.  Limit cheese to 1 oz (28 g) per day.  Eat more home-cooked food and less restaurant, buffet, and fast food.  Limit fried foods.  Cook foods using methods other than frying.  Limit canned vegetables. If you do use them, rinse them well to decrease the sodium.  When eating at a restaurant, ask that your food be prepared with less salt, or no salt if possible. WHAT FOODS CAN I EAT? Seek help from a dietitian for individual calorie needs. Grains Whole grain or whole wheat bread. Brown rice. Whole grain or whole wheat pasta. Quinoa,  bulgur, and whole grain cereals. Low-sodium cereals. Corn or whole wheat flour tortillas. Whole grain cornbread. Whole grain crackers. Low-sodium crackers. Vegetables Fresh or frozen vegetables (raw, steamed, roasted, or grilled). Low-sodium or reduced-sodium tomato and vegetable juices. Low-sodium or reduced-sodium tomato sauce and paste. Low-sodium or reduced-sodium canned vegetables.  Fruits All fresh, canned (in natural juice), or frozen fruits. Meat and Other Protein Products Ground beef (85% or leaner), grass-fed beef, or beef trimmed of fat. Skinless chicken or [place]. Ground chicken or [place]. Pork trimmed of fat. All fish and seafood. Eggs. Dried beans, peas, or lentils. Unsalted nuts and seeds. Unsalted canned beans. Dairy Low-fat dairy products, such as skim or 1% milk, 2% or reduced-fat cheeses, low-fat ricotta or cottage cheese, or plain low-fat yogurt. Low-sodium or reduced-sodium cheeses. Fats and Oils Tub margarines without trans fats. Light or reduced-fat mayonnaise and salad dressings (reduced sodium). Avocado. Safflower, olive, or canola oils. Natural peanut or almond butter. Other Unsalted popcorn and pretzels. The items listed above may not be a complete list of recommended foods or beverages. Contact your dietitian for more options. WHAT FOODS ARE NOT RECOMMENDED? Grains White bread. White pasta. White rice. Refined cornbread. Bagels and croissants. Crackers that contain trans fat. Vegetables Creamed or fried vegetables. Vegetables in a cheese sauce. Regular canned vegetables. Regular canned tomato sauce and paste. Regular tomato and vegetable juices. Fruits Dried fruits. Canned fruit in light or heavy syrup. Fruit juice. Meat and Other Protein Products Fatty cuts of meat. Ribs, chicken wings, bacon, sausage, bologna, salami, chitterlings,  fatback, hot dogs, bratwurst, and packaged luncheon meats. Salted nuts and seeds. Canned beans with salt. Dairy Whole or 2% milk,  cream, half-and-half, and cream cheese. Whole-fat or sweetened yogurt. Full-fat cheeses or blue cheese. Nondairy creamers and whipped toppings. Processed cheese, cheese spreads, or cheese curds. Condiments Onion and garlic salt, seasoned salt, table salt, and sea salt. Canned and packaged gravies. Worcestershire sauce. Tartar sauce. Barbecue sauce. Teriyaki sauce. Soy sauce, including reduced sodium. Steak sauce. Fish sauce. Oyster sauce. Cocktail sauce. Horseradish. Ketchup and mustard. Meat flavorings and tenderizers. Bouillon cubes. Hot sauce. Tabasco sauce. Marinades. Taco seasonings. Relishes. Fats and Oils Butter, stick margarine, lard, shortening, ghee, and bacon fat. Coconut, palm kernel, or palm oils. Regular salad dressings. Other Pickles and olives. Salted popcorn and pretzels. The items listed above may not be a complete list of foods and beverages to avoid. Contact your dietitian for more information. WHERE CAN I FIND MORE INFORMATION? National Heart, Lung, and Blood Institute: [employer]   This information is not intended to replace advice given to you by your health care provider. Make sure you discuss any questions you have with your health care provider.   Document Released: 05/25/2011 Document Revised: 06/26/2014 Document Reviewed: 04/09/2013 Elsevier Interactive Patient Education 2016 [employer].  Hypertension Hypertension, commonly called high blood pressure, is when the force of blood pumping through your arteries is too strong. Your arteries are the blood vessels that carry blood from your heart throughout your body. A blood pressure reading consists of a higher number over a lower number, such as 110/72. The higher number (systolic) is the pressure inside your arteries when your heart pumps. The lower number (diastolic) is the pressure inside your arteries when your heart relaxes. Ideally you want your blood pressure below  120/80. Hypertension forces your heart to work harder to pump blood. Your arteries may become narrow or stiff. Having untreated or uncontrolled hypertension can cause heart attack, stroke, kidney disease, and other problems. RISK FACTORS Some risk factors for high blood pressure are controllable. Others are not.  Risk factors you cannot control include:   Race. You may be at higher risk if you are African American.  Age. Risk increases with age.  Gender. Men are at higher risk than women before age 38 years. After age 81, women are at higher risk than men. Risk factors you can control include:  Not getting enough exercise or physical activity.  Being overweight.  Getting too much fat, sugar, calories, or salt in your diet.  Drinking too much alcohol. SIGNS AND SYMPTOMS Hypertension does not usually cause signs or symptoms. Extremely high blood pressure (hypertensive crisis) may cause headache, anxiety, shortness of breath, and nosebleed. DIAGNOSIS To check if you have hypertension, your health care provider will measure your blood pressure while you are seated, with your arm held at the level of your heart. It should be measured at least twice using the same arm. Certain conditions can cause a difference in blood pressure between your right and left arms. A blood pressure reading that is higher than normal on one occasion does not mean that you need treatment. If it is not clear whether you have high blood pressure, you may be asked to return on a different day to have your blood pressure checked again. Or, you may be asked to monitor your blood pressure at home for 1 or more weeks. TREATMENT Treating high blood pressure includes making lifestyle changes and possibly taking medicine. Living a healthy lifestyle can help  lower high blood pressure. You may need to change some of your habits. Lifestyle changes may include:  Following the DASH diet. This diet is high in fruits, vegetables, and  whole grains. It is low in salt, red meat, and added sugars.  Keep your sodium intake below 2,300 mg per day.  Getting at least 30-45 minutes of aerobic exercise at least 4 times per week.  Losing weight if necessary.  Not smoking.  Limiting alcoholic beverages.  Learning ways to reduce stress. Your health care provider may prescribe medicine if lifestyle changes are not enough to get your blood pressure under control, and if one of the following is true:  You are 16-79 years of age and your systolic blood pressure is above 140.  You are 43 years of age or older, and your systolic blood pressure is above 150.  Your diastolic blood pressure is above 90.  You have diabetes, and your systolic blood pressure is over 140 or your diastolic blood pressure is over 90.  You have kidney disease and your blood pressure is above 140/90.  You have heart disease and your blood pressure is above 140/90. Your personal target blood pressure may vary depending on your medical conditions, your age, and other factors. HOME CARE INSTRUCTIONS  Have your blood pressure rechecked as directed by your health care provider.   Take medicines only as directed by your health care provider. Follow the directions carefully. Blood pressure medicines must be taken as prescribed. The medicine does not work as well when you skip doses. Skipping doses also puts you at risk for problems.  Do not smoke.   Monitor your blood pressure at home as directed by your health care provider. SEEK MEDICAL CARE IF:   You think you are having a reaction to medicines taken.  You have recurrent headaches or feel dizzy.  You have swelling in your ankles.  You have trouble with your vision. SEEK IMMEDIATE MEDICAL CARE IF:  You develop a severe headache or confusion.  You have unusual weakness, numbness, or feel faint.  You have severe chest or abdominal pain.  You vomit repeatedly.  You have trouble  breathing. MAKE SURE YOU:   Understand these instructions.  Will watch your condition.  Will get help right away if you are not doing well or get worse.   This information is not intended to replace advice given to you by your health care provider. Make sure you discuss any questions you have with your health care provider.   Document Released: 06/05/2005 Document Revised: 10/20/2014 Document Reviewed: 03/28/2013 Elsevier Interactive Patient Education 2016 [employer].  How to Take Your Blood Pressure HOW DO I GET A BLOOD PRESSURE MACHINE?  You can buy an electronic home blood pressure machine at your local pharmacy. Insurance will sometimes cover the cost if you have a prescription.  Ask your doctor what type of machine is best for you. There are different machines for your arm and your wrist.  If you decide to buy a machine to check your blood pressure on your arm, first check the size of your arm so you can buy the right size cuff. To check the size of your arm:   Use a measuring tape that shows both inches and centimeters.   Wrap the measuring tape around the upper-middle part of your arm. You may need someone to help you measure.   Write down your arm measurement in both inches and centimeters.   To measure your blood  pressure correctly, it is important to have the right size cuff.   If your arm is up to 13 inches (up to 34 centimeters), get an adult cuff size.  If your arm is 13 to 17 inches (35 to 44 centimeters), get a large adult cuff size.    If your arm is 17 to 20 inches (45 to 52 centimeters), get an adult thigh cuff.  WHAT DO THE NUMBERS MEAN?   There are two numbers that make up your blood pressure. For example: 120/80.  The first number (120 in our example) is called the "systolic pressure." It is a measure of the pressure in your blood vessels when your heart is pumping blood.  The second number (80 in our example) is called the "diastolic  pressure." It is a measure of the pressure in your blood vessels when your heart is resting between beats.  Your doctor will tell you what your blood pressure should be. WHAT SHOULD I DO BEFORE I CHECK MY BLOOD PRESSURE?   Try to rest or relax for at least 30 minutes before you check your blood pressure.  Do not smoke.  Do not have any drinks with caffeine, such as:  Soda.  Coffee.  Tea.  Check your blood pressure in a quiet room.  Sit down and stretch out your arm on a table. Keep your arm at about the level of your heart. Let your arm relax.  Make sure that your legs are not crossed. HOW DO I CHECK MY BLOOD PRESSURE?  Follow the directions that came with your machine.  Make sure you remove any tight-fitting clothing from your arm or wrist. Wrap the cuff around your upper arm or wrist. You should be able to fit a finger between the cuff and your arm. If you cannot fit a finger between the cuff and your arm, it is too tight and should be removed and rewrapped.  Some units require you to manually pump up the arm cuff.  Automatic units inflate the cuff when you press a button.  Cuff deflation is automatic in both models.  After the cuff is inflated, the unit measures your blood pressure and pulse. The readings are shown on a monitor. Hold still and breathe normally while the cuff is inflated.  Getting a reading takes less than a minute.  Some models store readings in a memory. Some provide a printout of readings. If your machine does not store your readings, keep a written record.  Take readings with you to your next visit with your doctor.   This information is not intended to replace advice given to you by your health care provider. Make sure you discuss any questions you have with your health care provider.   Document Released: 05/18/2008 Document Revised: 06/26/2014 Document Reviewed: 07/31/2013 Elsevier Interactive Patient Education [employer]2016 Elsevier Inc.

## 2015-12-03 ENCOUNTER — Ambulatory Visit: Payer: Medicaid Other | Admitting: Family Medicine

## 2017-05-19 IMAGING — DX DG CHEST 2V
2 series · 2 of 2 positions shown · non-contrast
Comparison: 01/02/2008

CLINICAL DATA: Chest pain

EXAM:
CHEST  2 VIEW

[chest pa]
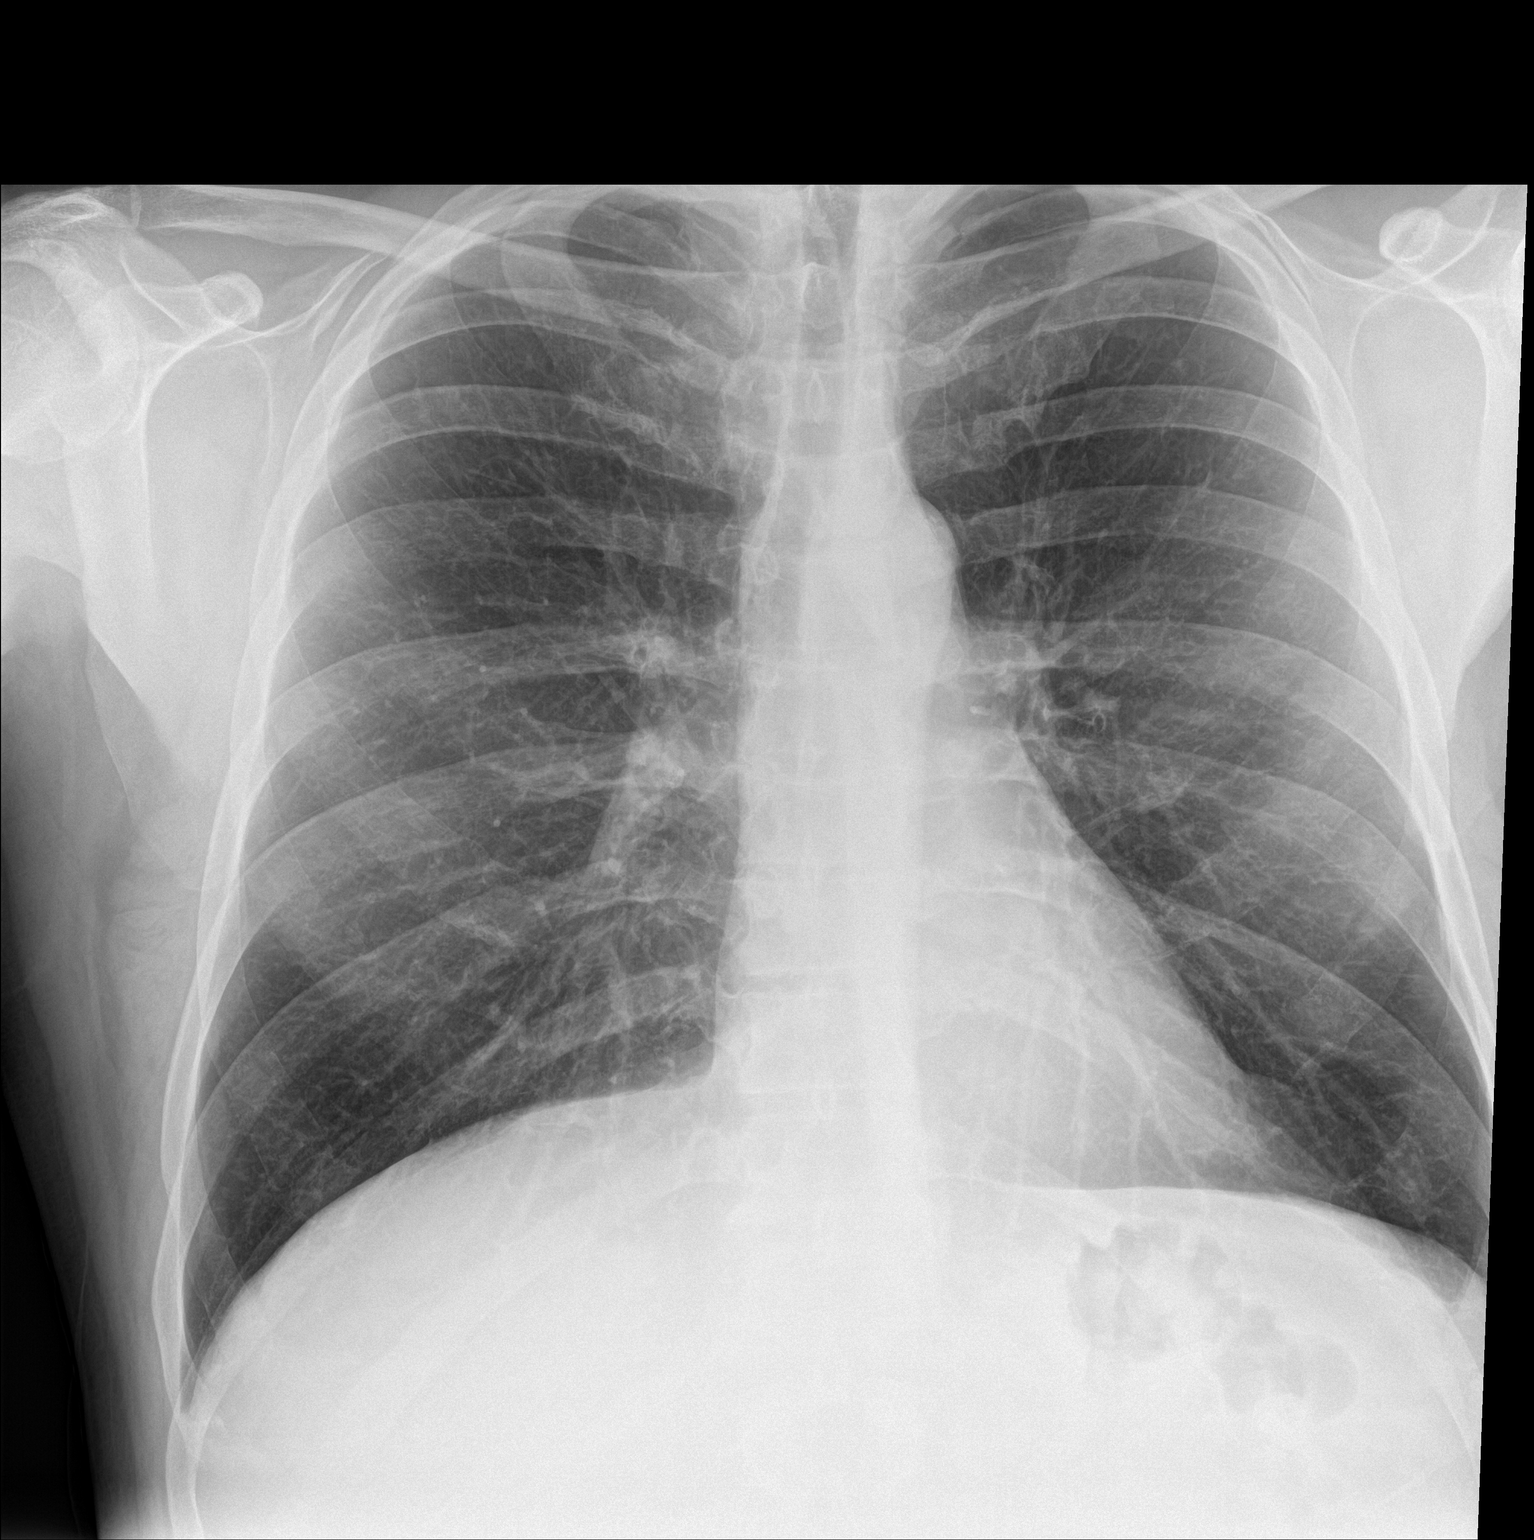

[chest lat]
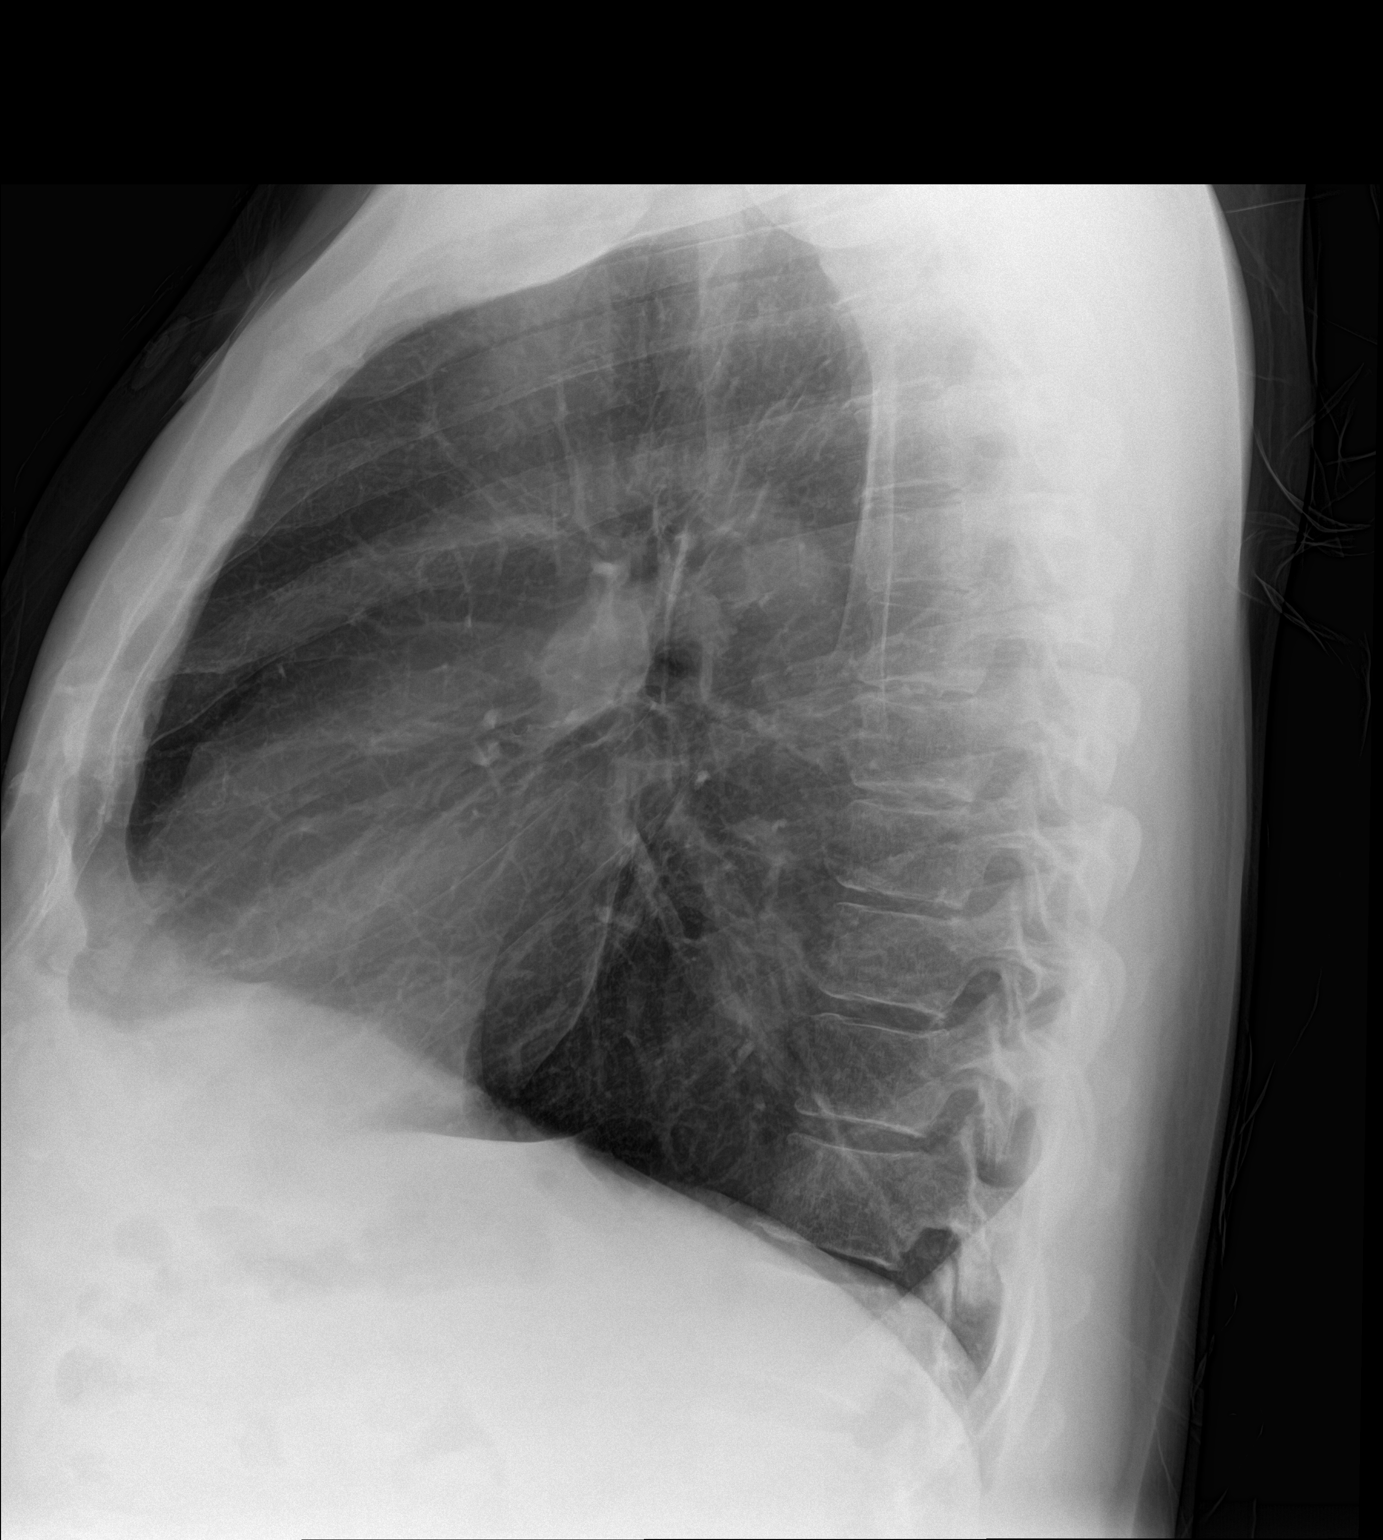

[2 of 2 positions shown; findings below may reference images not displayed]

FINDINGS: Cardiomediastinal silhouette is stable. No acute infiltrate or
pleural effusion. No pulmonary edema. Bony thorax is unremarkable.
IMPRESSION: No active cardiopulmonary disease.

## 2019-02-02 ENCOUNTER — Emergency Department (HOSPITAL_BASED_OUTPATIENT_CLINIC_OR_DEPARTMENT_OTHER)
Admission: EM | Admit: 2019-02-02 | Discharge: 2019-02-02 | Disposition: A | Discharge status: Home / Self Care | Payer: Self-pay | Attending: Emergency Medicine | Admitting: Emergency Medicine

## 2019-02-02 ENCOUNTER — Encounter (HOSPITAL_BASED_OUTPATIENT_CLINIC_OR_DEPARTMENT_OTHER): Payer: Self-pay | Admitting: Emergency Medicine

## 2019-02-02 ENCOUNTER — Other Ambulatory Visit: Payer: Self-pay

## 2019-02-02 DIAGNOSIS — F17210 Nicotine dependence, cigarettes, uncomplicated: Secondary | ICD-10-CM | POA: Insufficient documentation

## 2019-02-02 DIAGNOSIS — I4891 Unspecified atrial fibrillation: Secondary | ICD-10-CM | POA: Insufficient documentation

## 2019-02-02 DIAGNOSIS — H109 Unspecified conjunctivitis: Secondary | ICD-10-CM

## 2019-02-02 DIAGNOSIS — H1033 Unspecified acute conjunctivitis, bilateral: Secondary | ICD-10-CM | POA: Insufficient documentation

## 2019-02-02 HISTORY — DX: Unspecified atrial fibrillation: I48.91

## 2019-02-02 MED ORDER — OFLOXACIN 0.3 % OP SOLN: 1.0000 [drp] | Freq: Four times a day (QID) | OPHTHALMIC | 0 refills | Status: AC

## 2019-02-02 NOTE — ED Triage Notes (Signed)
Bilateral eye redness and pain for 2 weeks. Been treated with abx eye drops without relief.

## 2019-02-02 NOTE — ED Provider Notes (Signed)
[facility] EMERGENCY DEPARTMENT Provider Note   CSN: [id number] Arrival date & time: 02/02/19  1253    History   Chief Complaint Chief Complaint  Patient presents with  . Eye Problem    HPI Kevin Duncan is a 48 y.o. male.     Patient started with some mild eye redness 2 weeks ago, was on erythromycin eye ointment for 2 to 3 days with minimal improvement and was switched to antihistamine eyedrops.  However, his partner has developed left eye redness today.  Patient continues to not have improvement with worsening redness to both eyes.  No vision changes.  No pain with movement of his eyes.  No trauma.  The history is provided by the patient.  Conjunctivitis This is a new problem. The current episode started more than 1 week ago. The problem occurs daily. The problem has not changed since onset.Pertinent negatives include no chest pain, no abdominal pain, no headaches and no shortness of breath. Nothing aggravates the symptoms. Nothing relieves the symptoms. He has tried nothing for the symptoms. The treatment provided no relief.    Past Medical History:  Diagnosis Date  . Atrial fibrillation (HCC)     There are no active problems to display for this patient.   Past Surgical History:  Procedure Laterality Date  . THUMB FUSION          Home Medications    Prior to Admission medications   Medication Sig Start Date End Date Taking? Authorizing Provider  aspirin 325 MG tablet Take 325 mg by mouth every 6 (six) hours as needed for mild pain.    [provider]  Aspirin-Salicylamide-Caffeine (BC HEADACHE POWDER PO) Take 1 packet by mouth every 6 (six) hours as needed (headache).    [provider]  ofloxacin (OCUFLOX) 0.3 % ophthalmic solution Place 1 drop into both eyes 4 (four) times daily for 7 days. 02/02/19 02/09/19  Virgina Norfolkuratolo, Vani Gunner, DO    Family History No family history on file.  Social History Social History   Tobacco Use  .  Smoking status: Current Every Day Smoker    Packs/day: 1.50    Types: Cigarettes  . Smokeless tobacco: Never Used  Substance Use Topics  . Alcohol use: Yes    Comment: 5-6 beer daily  . Drug use: No     Allergies   Hydrocodone   Review of Systems Review of Systems  Constitutional: Negative for chills and fever.  HENT: Negative for ear pain and sore throat.   Eyes: Positive for discharge, redness and itching. Negative for photophobia, pain and visual disturbance.  Respiratory: Negative for cough and shortness of breath.   Cardiovascular: Negative for chest pain and palpitations.  Gastrointestinal: Negative for abdominal pain and vomiting.  Genitourinary: Negative for dysuria and hematuria.  Musculoskeletal: Negative for arthralgias and back pain.  Skin: Negative for color change and rash.  Neurological: Negative for seizures, syncope and headaches.  All other systems reviewed and are negative.    Physical Exam Updated Vital Signs BP 131/75 (BP Location: Left Arm)   Pulse 60   Temp 98.7 F (37.1 C) (Oral)   Resp 20   Ht 6' (1.829 m)   Wt 92.1 kg   SpO2 99%   BMI 27.53 kg/m   Physical Exam Vitals signs and nursing note reviewed.  Constitutional:      Appearance: He is well-developed.  HENT:     Head: Normocephalic and atraumatic.     Nose: Nose normal.  Mouth/Throat:     Mouth: Mucous membranes are moist.  Eyes:     General:        Right eye: No discharge.        Left eye: No discharge.     Extraocular Movements: Extraocular movements intact.     Pupils: Pupils are equal, round, and reactive to light.     Comments: Both conjunctiva are red and inflamed, pupils are equal and reactive, there is no swelling around the orbital area, extraocular movements are intact without any pain  Neck:     Musculoskeletal: Neck supple.  Cardiovascular:     Rate and Rhythm: Normal rate and regular rhythm.     Heart sounds: No murmur.  Pulmonary:     Effort: Pulmonary  effort is normal. No respiratory distress.     Breath sounds: Normal breath sounds.  Abdominal:     Palpations: Abdomen is soft.     Tenderness: There is no abdominal tenderness.  Skin:    General: Skin is warm and dry.  Neurological:     Mental Status: He is alert.      ED Treatments / Results  Labs (all labs ordered are listed, but only abnormal results are displayed) Labs Reviewed - No data to display  EKG None  Radiology No results found.  Procedures Procedures (including critical care time)  Medications Ordered in ED Medications - No data to display   Initial Impression / Assessment and Plan / ED Course  I have reviewed the triage vital signs and the nursing notes.  Pertinent labs & imaging results that were available during my care of the patient were reviewed by me and considered in my medical decision making (see chart for details).        Kevin Duncan is a 48 year old male who presents to the ED with eye redness bilaterally.  Patient with no fever.  Normal vitals.  Patient states that redness started mildly about 2 weeks ago.  He was on antibiotic eye ointment that he only used for 2 to 3 days.  He then started an antihistamine eye ointment for possible viral conjunctivitis.  However, patient's partner now has developed pinkeye in the left eye.  He states that every morning his eyes are matted shut.  I believe patient likely has a bilateral bacterial conjunctivitis.  Will treat with Cipro eye ointment.  Denies any trauma.  No visual changes.  No signs to suggest periorbital or septal cellulitis.  No consensual photophobia.  Does not appear to be uveitis.  Will give him follow-up with ophthalmology given that symptoms have been ongoing for 2 weeks. Given return precautions and discharged in ED in good condition.  This chart was dictated using voice recognition software.  Despite best efforts to proofread,  errors can occur which can change the documentation  meaning.    Final Clinical Impressions(s) / ED Diagnoses   Final diagnoses:  Conjunctivitis of both eyes, unspecified conjunctivitis type    ED Discharge Orders         Ordered    ofloxacin (OCUFLOX) 0.3 % ophthalmic solution  4 times daily     02/02/19 1312           [place], DO 02/02/19 1325

## 2019-02-04 ENCOUNTER — Ambulatory Visit: Payer: Self-pay | Admitting: Cardiology

## 2019-02-04 ENCOUNTER — Encounter: Payer: Self-pay | Admitting: *Deleted

## 2019-02-04 DIAGNOSIS — I4891 Unspecified atrial fibrillation: Secondary | ICD-10-CM | POA: Insufficient documentation

## 2019-02-05 ENCOUNTER — Encounter: Payer: Self-pay | Admitting: *Deleted

## 2019-02-05 ENCOUNTER — Other Ambulatory Visit: Payer: Self-pay

## 2019-02-05 ENCOUNTER — Ambulatory Visit (INDEPENDENT_AMBULATORY_CARE_PROVIDER_SITE_OTHER): Payer: Self-pay | Admitting: Cardiology

## 2019-02-05 ENCOUNTER — Encounter: Payer: Self-pay | Admitting: Cardiology

## 2019-02-05 VITALS — BP 120/90 | HR 73 | Ht 72.0 in | Wt 218.0 lb

## 2019-02-05 DIAGNOSIS — I4819 Other persistent atrial fibrillation: Secondary | ICD-10-CM

## 2019-02-05 DIAGNOSIS — F17210 Nicotine dependence, cigarettes, uncomplicated: Secondary | ICD-10-CM | POA: Insufficient documentation

## 2019-02-05 DIAGNOSIS — R079 Chest pain, unspecified: Secondary | ICD-10-CM

## 2019-02-05 DIAGNOSIS — R0609 Other forms of dyspnea: Secondary | ICD-10-CM | POA: Insufficient documentation

## 2019-02-05 DIAGNOSIS — R0789 Other chest pain: Secondary | ICD-10-CM | POA: Insufficient documentation

## 2019-02-05 DIAGNOSIS — I10 Essential (primary) hypertension: Secondary | ICD-10-CM

## 2019-02-05 MED ORDER — RIVAROXABAN 20 MG PO TABS: 20.0000 mg | ORAL_TABLET | Freq: Every day | ORAL | Status: DC

## 2019-02-05 NOTE — Addendum Note (Signed)
Addended by: Particia Nearing B on: 02/05/2019 04:39 PM   Modules accepted: Orders

## 2019-02-05 NOTE — Patient Instructions (Signed)
Medication Instructions:  Your physician recommends that you continue on your current medications as directed. Please refer to the Current Medication list given to you today.  If you need a refill on your cardiac medications before your next appointment, please call your pharmacy.   Lab work: NOne If you have labs (blood work) drawn today and your tests are completely normal, you will receive your results only by: Marland Kitchen MyChart Message (if you have MyChart) OR . A paper copy in the mail If you have any lab test that is abnormal or we need to change your treatment, we will call you to review the results.  Testing/Procedures: Your physician has requested that you have an echocardiogram. Echocardiography is a painless test that uses sound waves to create images of your heart. It provides your doctor with information about the size and shape of your heart and how well your heart's chambers and valves are working. This procedure takes approximately one hour. There are no restrictions for this procedure.  Your echo is scheduled for Sept 25,2020 at [place] has requested that you have a lexiscan myoview. For further information please visit [email or web address]. Please follow instruction sheet, as given.   Follow-Up: At [facility], you and your health needs are our priority.  As part of our continuing mission to provide you with exceptional heart care, we have created designated Provider Care Teams.  These Care Teams include your primary Cardiologist (physician) and Advanced Practice Providers (APPs -  Physician Assistants and Nurse Practitioners) who all work together to provide you with the care you need, when you need it. You will need a follow up appointment in 1 months.  Please call our office 2 months in advance to schedule this appointment.  You may see No primary care provider on file. or another member of our [employer] Provider Team in [place]: Jenne Campus, MD . Shirlee More, MD  Any Other Special Instructions Will Be Listed Below (If Applicable).

## 2019-02-05 NOTE — Progress Notes (Signed)
Cardiology Office Note:    Date:  02/05/2019   ID:  Kevin Richardsalvin D Hadsall, DOB 06/29/1970, MRN [id number]  PCP:  Lance BoschBlevins, Andrea, NP  Cardiologist:  Garwin Brothersajan R Lakara Weiland, MD   Referring MD: Lance BoschBlevins, Andrea, NP    ASSESSMENT:    1. Persistent atrial fibrillation   2. Essential hypertension   3. Chest discomfort   4. Dyspnea on exertion    PLAN:    In order of problems listed above:  1. Newly diagnosed atrial fibrillation: I agree with primary care physician.  Patient is on calcium channel blockers for rate control.  His chads score is 1 but in view of the fact that we will consider cardioversion we will continue Xarelto which she started yesterday.  He is taking 20 mg daily. 2. Essential hypertension: Echocardiogram will be done to assess murmur heard on auscultation.  His blood pressure stable. 3. Chest discomfort: Lexiscan sestamibi will be done to assess for coronary artery disease for objective evidence 4. Cigarette smoking: I spent 5 minutes with the patient discussing solely about smoking. Smoking cessation was counseled. I suggested to the patient also different medications and pharmacological interventions. Patient is keen to try stopping on its own at this time. He will get back to me if he needs any further assistance in this matter.  I also told him to wean off and refrain from alcohol use 5. Patient will be seen in follow-up appointment in 1 months or earlier if the patient has any concerns    Medication Adjustments/Labs and Tests Ordered: Current medicines are reviewed at length with the patient today.  Concerns regarding medicines are outlined above.  No orders of the defined types were placed in this encounter.  No orders of the defined types were placed in this encounter.    History of Present Illness:    Kevin RichardsCalvin D Milazzo is a 48 y.o. male who is being seen today for the evaluation of atrial fibrillation at the request of Lance BoschBlevins, Andrea, NP.  Patient is a pleasant  48 year old male.  He has past medical history of essential hypertension.  He is a Corporate investment bankerconstruction worker.  He was found to be in atrial fibrillation with elevated ventricular rate and therefore treated.  He could not tolerate Eliquis therefore he has been started on Xarelto and he is taking 20 mg on a daily basis.  He denies any chest pain orthopnea or PND but he is getting easy fatigability.  He has shortness of breath on exertion ever since he feels his been atrial fibrillation.  At the time of my evaluation, the patient is alert awake oriented and in no distress.  Past Medical History:  Diagnosis Date   Atrial fibrillation (HCC)    Hypertension     Past Surgical History:  Procedure Laterality Date   THUMB FUSION Bilateral     Current Medications: Current Meds  Medication Sig   Aspirin-Salicylamide-Caffeine (BC HEADACHE POWDER PO) Take 1 packet by mouth every 6 (six) hours as needed (headache).   CARTIA XT 120 MG 24 hr capsule TAKE 1 CAPSULE BY MOUTH ONCE DAILY FOR A FIB AND HYPERTENSION   ofloxacin (OCUFLOX) 0.3 % ophthalmic solution Place 1 drop into both eyes 4 (four) times daily for 7 days.     Allergies:   Hydrocodone   Social History   Socioeconomic History   Marital status: Married    Spouse name: Not on file   Number of children: Not on file   Years of education: Not on  file   Highest education level: Not on file  Occupational History   Not on file  Social Needs   Financial resource strain: Not on file   Food insecurity    Worry: Not on file    Inability: Not on file   Transportation needs    Medical: Not on file    Non-medical: Not on file  Tobacco Use   Smoking status: Current Every Day Smoker    Packs/day: 1.50    Types: Cigarettes   Smokeless tobacco: Never Used  Substance and Sexual Activity   Alcohol use: Yes    Comment: 5-6 beer daily   Drug use: No   Sexual activity: Not on file  Lifestyle   Physical activity    Days per week:  Not on file    Minutes per session: Not on file   Stress: Not on file  Relationships   Social connections    Talks on phone: Not on file    Gets together: Not on file    Attends religious service: Not on file    Active member of club or organization: Not on file    Attends meetings of clubs or organizations: Not on file    Relationship status: Not on file  Other Topics Concern   Not on file  Social History Narrative   Not on file     Family History: The patient's family history includes Diabetes in his maternal grandmother and mother; Heart disease in his brother, father, and paternal grandmother.  ROS:   Please see the history of present illness.    All other systems reviewed and are negative.  EKGs/Labs/Other Studies Reviewed:    The following studies were reviewed today: EKG reveals atrial fibrillation with nonspecific ST-T changes   Recent Labs: No results found for requested labs within last 8760 hours.  Recent Lipid Panel No results found for: CHOL, TRIG, HDL, CHOLHDL, VLDL, LDLCALC, LDLDIRECT  Physical Exam:    VS:  BP 120/90 (BP Location: Left Arm, Patient Position: Sitting, Cuff Size: Normal)    Pulse 73    Ht 6' (1.829 m)    Wt 218 lb (98.9 kg)    SpO2 97%    BMI 29.57 kg/m     Wt Readings from Last 3 Encounters:  02/05/19 218 lb (98.9 kg)  02/02/19 203 lb (92.1 kg)  11/09/15 197 lb (89.4 kg)     GEN: Patient is in no acute distress HEENT: Normal NECK: No JVD; No carotid bruits LYMPHATICS: No lymphadenopathy CARDIAC: S1 S2 regular, 2/6 systolic murmur at the apex. RESPIRATORY:  Clear to auscultation without rales, wheezing or rhonchi  ABDOMEN: Soft, non-tender, non-distended MUSCULOSKELETAL:  No edema; No deformity  SKIN: Warm and dry NEUROLOGIC:  Alert and oriented x 3 PSYCHIATRIC:  Normal affect    Signed, Jenean Lindau, MD  02/05/2019 4:14 PM    [facility]

## 2019-02-13 ENCOUNTER — Telehealth: Payer: Self-pay | Admitting: Cardiology

## 2019-02-13 NOTE — Telephone Encounter (Signed)
Patient girlfriend call and states that the Eliquis makes him feel bad, he is at work and  Called her to tell her to call us.Marland Kitchen

## 2019-02-13 NOTE — Telephone Encounter (Signed)
Patient states he feels wore out all of a sudden, he feels totally drained after going outside. He is gonna take a hydration pack now. No SOB, no swelling, no nausea vomiting, no pain at all. Legs feel jiggly like jello. RN advised patient to increase his fluid intake and seek immediate attention, if symptoms worsen. Patient in agreement, no further questions. Information relayed to Dr. Docia Furl for advice.

## 2019-02-27 ENCOUNTER — Telehealth (HOSPITAL_COMMUNITY): Payer: Self-pay | Admitting: *Deleted

## 2019-02-27 NOTE — Telephone Encounter (Signed)
Patient given detailed instructions per Myocardial Perfusion Study Information Sheet for the test on 03/06/19. Patient notified to arrive 15 minutes early and that it is imperative to arrive on time for appointment to keep from having the test rescheduled.  If you need to cancel or reschedule your appointment, please call the office within 24 hours of your appointment. . Patient verbalized understanding. Kevin Duncan    

## 2019-03-14 ENCOUNTER — Other Ambulatory Visit: Payer: Self-pay

## 2019-03-28 ENCOUNTER — Ambulatory Visit: Payer: Self-pay | Admitting: Cardiology

## 2019-05-03 ENCOUNTER — Emergency Department (HOSPITAL_BASED_OUTPATIENT_CLINIC_OR_DEPARTMENT_OTHER): Payer: Self-pay

## 2019-05-03 ENCOUNTER — Emergency Department (HOSPITAL_BASED_OUTPATIENT_CLINIC_OR_DEPARTMENT_OTHER)
Admission: EM | Admit: 2019-05-03 | Discharge: 2019-05-03 | Disposition: A | Discharge status: Home / Self Care | Payer: Self-pay | Attending: Emergency Medicine | Admitting: Emergency Medicine

## 2019-05-03 ENCOUNTER — Encounter (HOSPITAL_BASED_OUTPATIENT_CLINIC_OR_DEPARTMENT_OTHER): Payer: Self-pay

## 2019-05-03 ENCOUNTER — Other Ambulatory Visit: Payer: Self-pay

## 2019-05-03 DIAGNOSIS — M25561 Pain in right knee: Secondary | ICD-10-CM | POA: Insufficient documentation

## 2019-05-03 DIAGNOSIS — F17210 Nicotine dependence, cigarettes, uncomplicated: Secondary | ICD-10-CM | POA: Insufficient documentation

## 2019-05-03 DIAGNOSIS — Z7901 Long term (current) use of anticoagulants: Secondary | ICD-10-CM | POA: Insufficient documentation

## 2019-05-03 DIAGNOSIS — Z79899 Other long term (current) drug therapy: Secondary | ICD-10-CM | POA: Insufficient documentation

## 2019-05-03 DIAGNOSIS — I4891 Unspecified atrial fibrillation: Secondary | ICD-10-CM | POA: Insufficient documentation

## 2019-05-03 DIAGNOSIS — I10 Essential (primary) hypertension: Secondary | ICD-10-CM | POA: Insufficient documentation

## 2019-05-03 NOTE — ED Notes (Signed)
ED Provider at bedside. 

## 2019-05-03 NOTE — ED Triage Notes (Signed)
Pt BIB s/o with c/o pain and swelling to right knee. Knee does appear to be red and swollen. Pt limped to wheelchair. Denies injury, denies taking any medication at home for pain.

## 2019-05-03 NOTE — Discharge Instructions (Addendum)
Please read instructions below. Apply ice to your knee for 20 minutes at a time. Elevate it as much as possible to help with swelling. You can take over-the-counter medication as directed as needed for pain. Schedule an appointment with your primary care provider regarding your visit today.  Return to the ER for fever, significantly worsening swelling with redness, or new or concerning symptoms.

## 2019-05-03 NOTE — ED Notes (Signed)
Pt verbalized understanding of dc instructions.

## 2019-05-03 NOTE — ED Provider Notes (Addendum)
[facility] EMERGENCY DEPARTMENT Provider Note   CSN: [id number] Arrival date & time: 05/03/19  0930     History   Chief Complaint Chief Complaint  Patient presents with  . Knee Pain    HPI Kevin Duncan is a 48 y.o. male with past medical history of A. fib noncompliant on Xarelto, hypertension, presenting to the emergency department with gradual onset of right knee pain and swelling for few days.  He states he does not know of a particular injury, though he has had on and off pain to this knee for some time.  The pain is worse anteriorly and just superior to the patella.  He does not have any problems bearing weight though bending the knee causes more pain.  Denies fever, history of IV drug use, hx of immunocompromise, or history of gout.  He does have A. fib and has been noncompliant with his Xarelto for about 1 month due to cost of medication.  He states he was also discussing an ablation with his PCP/cardiologist, however he cannot afford that visit either.     The history is provided by the patient.    Past Medical History:  Diagnosis Date  . Atrial fibrillation (HCC)   . Hypertension     Patient Active Problem List   Diagnosis Date Noted  . Essential hypertension 02/05/2019  . Chest discomfort 02/05/2019  . Dyspnea on exertion 02/05/2019  . Cigarette smoker 02/05/2019  . Atrial fibrillation [facility])     Past Surgical History:  Procedure Laterality Date  . THUMB FUSION Bilateral         Home Medications    Prior to Admission medications   Medication Sig Start Date End Date Taking? Authorizing Provider  Aspirin-Salicylamide-Caffeine (BC HEADACHE POWDER PO) Take 1 packet by mouth every 6 (six) hours as needed (headache).    [provider]  CARTIA XT 120 MG 24 hr capsule TAKE 1 CAPSULE BY MOUTH ONCE DAILY FOR A FIB AND HYPERTENSION 01/09/19   [provider]  rivaroxaban (XARELTO) 20 MG TABS tablet Take 1 tablet (20 mg total) by mouth  daily with supper. 02/05/19   Revankar, Aundra Dubin, MD    Family History Family History  Problem Relation Age of Onset  . Diabetes Mother   . Heart disease Father   . Heart disease Brother   . Diabetes Maternal Grandmother   . Heart disease Paternal Grandmother     Social History Social History   Tobacco Use  . Smoking status: Current Every Day Smoker    Packs/day: 1.50    Types: Cigarettes  . Smokeless tobacco: Never Used  Substance Use Topics  . Alcohol use: Yes    Comment: 5-6 beer daily  . Drug use: No     Allergies   Hydrocodone   Review of Systems Review of Systems  Constitutional: Negative for fever.  Musculoskeletal: Positive for arthralgias and joint swelling.  Skin: Negative for color change and wound.  All other systems reviewed and are negative.    Physical Exam Updated Vital Signs BP (!) 166/115 (BP Location: Right Arm)   Pulse 77   Temp 97.8 F (36.6 C) (Oral)   Resp 18   Ht 6\' 1"  (1.854 m)   Wt 93 kg   SpO2 96%   BMI 27.05 kg/m   Physical Exam Vitals signs and nursing note reviewed.  Constitutional:      General: He is not in acute distress.    Appearance: He is  well-developed.  HENT:     Head: Normocephalic and atraumatic.  Eyes:     Conjunctiva/sclera: Conjunctivae normal.  Cardiovascular:     Rate and Rhythm: Normal rate.  Pulmonary:     Effort: Pulmonary effort is normal.  Musculoskeletal:     Comments: Right knee with mild-moderate swelling anteriorly. There is TTP just superior to the patella and midline. NO redness or warmth compared to the left. Normal ROM though causes pain. No pain with weight bearing. No TTP to popliteal region, medial/lateral joint lines or calf. No distal swelling. No deformity or wounds. Normal sensation  Neurological:     Mental Status: He is alert.  Psychiatric:        Mood and Affect: Mood normal.        Behavior: Behavior normal.      ED Treatments / Results  Labs (all labs ordered are  listed, but only abnormal results are displayed) Labs Reviewed - No data to display  EKG None  Radiology Dg Knee Complete 4 Views Right  Result Date: 05/03/2019 CLINICAL DATA:  Right knee pain with flexion for the past 3 days. No known injury. EXAM: RIGHT KNEE - COMPLETE 4+ VIEW COMPARISON:  None. FINDINGS: Moderate diffuse anterior soft tissue swelling. No effusion. Fragmented proximal patellar enthesophyte. Minimal medial spur formation. IMPRESSION: 1. Moderate diffuse anterior soft tissue swelling without underlying bony abnormality. 2. Minimal degenerative changes. Electronically Signed   By: Beckie SaltsSteven  Reid M.D.   On: 05/03/2019 10:55    Procedures Procedures (including critical care time)  Medications Ordered in ED Medications - No data to display   Initial Impression / Assessment and Plan / ED Course  I have reviewed the triage vital signs and the nursing notes.  Pertinent labs & imaging results that were available during my care of the patient were reviewed by me and considered in my medical decision making (see chart for details).       Patient presenting with intermittent right knee pain and swelling, worsening few days ago.  No systemic symptoms or history of gout.  Patient is able to bear weight without pain though flexion does cause pain.  On exam, the joint is not red or hot.  There is some mild to moderate swelling appreciated anteriorly though no significant tenderness is appreciated.  No tenderness to the popliteal region or calf.  Sensation and intact pulses.  X-ray obtained which reveals mild degenerative changes and anterior soft tissue swelling without joint effusion.  Patient discussed with and evaluated by Dr. Manus Gunningancour.  Unlikely septic joint or gout.  Swelling is superior to the patella, and no TTP is presenting over the patella, low suspicion for bursitis. Unlikely DVT given location of swelling anterior to the joint.  At this time recommend symptomatic management  including RICE therapy, over-the-counter medications as needed for pain.  Outpatient follow-up indicated.  Discussed importance of anticoagulation given patient's history of A. fib.  Encouraged to follow-up closely with his primary care provider to discuss alternative options for anticoagulation.  Discussed risks of anticoagulation noncompliance in the setting of atrial fibrillation.  Patient verbalized understanding and agrees with care plan.  Discussed results, findings, treatment and follow up. Patient advised of return precautions. Patient verbalized understanding and agreed with plan.   Final Clinical Impressions(s) / ED Diagnoses   Final diagnoses:  Right knee pain, unspecified chronicity    ED Discharge Orders    None       Robinson, [place]il[place] N, PA-C 05/03/19 1144  Robinson, [place] N, PA-C 05/03/19 1144    Ezequiel Essex, MD 05/03/19 [phone]

## 2019-09-08 ENCOUNTER — Other Ambulatory Visit: Payer: Self-pay

## 2019-09-08 ENCOUNTER — Ambulatory Visit: Payer: Self-pay | Admitting: Family Medicine

## 2019-09-08 ENCOUNTER — Ambulatory Visit: Payer: Self-pay | Attending: Family Medicine | Admitting: Family Medicine

## 2019-09-08 ENCOUNTER — Encounter: Payer: Self-pay | Admitting: Family Medicine

## 2019-09-08 DIAGNOSIS — R609 Edema, unspecified: Secondary | ICD-10-CM

## 2019-09-08 DIAGNOSIS — R0602 Shortness of breath: Secondary | ICD-10-CM

## 2019-09-08 DIAGNOSIS — Z720 Tobacco use: Secondary | ICD-10-CM

## 2019-09-08 DIAGNOSIS — I4811 Longstanding persistent atrial fibrillation: Secondary | ICD-10-CM

## 2019-09-08 DIAGNOSIS — I10 Essential (primary) hypertension: Secondary | ICD-10-CM

## 2019-09-08 DIAGNOSIS — R600 Localized edema: Secondary | ICD-10-CM

## 2019-09-08 DIAGNOSIS — Z9229 Personal history of other drug therapy: Secondary | ICD-10-CM

## 2019-09-08 DIAGNOSIS — R200 Anesthesia of skin: Secondary | ICD-10-CM

## 2019-09-08 MED ORDER — CARTIA XT 120 MG PO CP24: ORAL_CAPSULE | ORAL | 11 refills | Status: DC

## 2019-09-08 MED ORDER — RIVAROXABAN 20 MG PO TABS: 20.0000 mg | ORAL_TABLET | Freq: Every day | ORAL | 11 refills | Status: DC

## 2019-09-08 MED FILL — CARTIA XT 120 MG CP24: 120 | 30 days supply | Qty: 30 | Fill #0

## 2019-09-08 MED FILL — XARELTO 20 MG TABLET: 20 | 30 days supply | Qty: 30 | Fill #0

## 2019-09-08 NOTE — Progress Notes (Signed)
Virtual Visit via Telephone Note  I connected with Kevin Duncan on 09/08/19 at  8:50 AM EDT by telephone and verified that I am speaking with the correct person using two identifiers.   I discussed the limitations, risks, security and privacy concerns of performing an evaluation and management service by telephone and the availability of in person appointments. I also discussed with the patient that there may be a patient responsible charge related to this service. The patient expressed understanding and agreed to proceed.  Patient Location: Home Provider Location: [facility] Others participating in call: none   History of Present Illness:         49 year old male with history of persistent atrial fibrillation and use of anticoagulant medication who needs to establish care.  He reports that he is uninsured and has been unable to afford the blood thinning medicine, Xarelto.  He has been out of the medication for about 4 months.  He is not currently taking any aspirin or other blood thinning medication.  He has had no focal numbness or weakness or other strokelike symptoms.  He denies any chest pain or sensation of palpitations.  He does report chronic shortness of breath which increases with activity.  He also has bilateral swelling of his legs to about the mid leg and when he takes his socks off he will still have the impression of the socks on his legs due to the swelling.  He also has had some numbness and tingling in his feet which started this year.  He reports that he does have a strong family history of diabetes and wonders of diabetes may be causing the numbness and tingling sensation in his feet.  He also smokes but reports that he is trying to decrease his tobacco use due to his shortness of breath.  He also reports longstanding issues with pain in the right knee and foot and has been told that this is related to arthritis.  He also has recurrent left heel pain.   Past Medical History:    Diagnosis Date  . Atrial fibrillation ([place])   . Hypertension     Past Surgical History:  Procedure Laterality Date  . THUMB FUSION Bilateral     Family History  Problem Relation Age of Onset  . Diabetes Mother   . Heart disease Father   . Heart disease Brother   . Diabetes Maternal Grandmother   . Heart disease Paternal Grandmother     Social History   Tobacco Use  . Smoking status: Current Every Day Smoker    Packs/day: 1.50    Types: Cigarettes  . Smokeless tobacco: Never Used  Substance Use Topics  . Alcohol use: Yes    Comment: 5-6 beer daily  . Drug use: No     Allergies  Allergen Reactions  . Hydrocodone Hives       Observations/Objective: No vital signs or physical exam conducted as visit was done via telephone  Assessment and Plan: 1. Longstanding persistent atrial fibrillation ([place]) Patient was encouraged to start 325 mg aspirin once daily until he can obtain refill of Xarelto through this pharmacy.  He is aware of the risk of embolic stroke associated with atrial fibrillation.  He reports that his blood pressure and heart rate have remained controlled on Cartia and prescription is sent to this pharmacy for refill. - CARTIA XT 120 MG 24 hr capsule; TAKE 1 CAPSULE BY MOUTH ONCE DAILY FOR A FIB AND HYPERTENSION  Dispense: 30 capsule; Refill:  11 - rivaroxaban (XARELTO) 20 MG TABS tablet; Take 1 tablet (20 mg total) by mouth daily with supper.  Dispense: 30 tablet; Refill: 11  2. Essential hypertension Continue use of Cartia for control of blood pressure and heart rate - CARTIA XT 120 MG 24 hr capsule; TAKE 1 CAPSULE BY MOUTH ONCE DAILY FOR A FIB AND HYPERTENSION  Dispense: 30 capsule; Refill: 11  3. Peripheral edema He reports issues with peripheral edema and he will have basic metabolic panel in BNP at future lab visit in approximately 2 weeks. - Basic Metabolic Panel; Future - Brain natriuretic peptide; Future  4. Shortness of breath Patient has  long-term smoker but also has issues with peripheral edema.  Will check basic metabolic panel for electrolyte abnormality, BNP for possible congestive heart failure and at upcoming appointment, will discuss chest x-ray and smoking cessation - Basic Metabolic Panel; Future - Brain natriuretic peptide; Future  5. Numbness in feet He reports that he has had onset within the past few months of numbness in his feet and has a strong family history of diabetes.  Patient will have basic metabolic panel and hemoglobin J8S at his upcoming visit to look for diabetes/electrolyte abnormality that may be contributing to the numbness in his feet.  He will also need in person exam to look for peripheral vascular disease as patient with history of continued smoking.  He also has issues with recurrent edema which may be contributing to numbness/abnormal sensation in the feet. - Basic Metabolic Panel; Future -Hemoglobin A1c; Future  6. Tobacco use Discussed the need for smoking cessation and when patient returns for follow-up, this will be discussed in further detail as well as recommendation for patient to meet with clinical pharmacist for assistance with smoking cessation  7. History of anticoagulant use Patient will have CBC in follow-up of his history of anticoagulant use to look for anemia or platelet disorder.  He has been advised to start 325 mg aspirin once daily after meal until Xarelto can be obtained for patient to restart - CBC  Follow Up Instructions:Return in about 5 weeks (around 10/13/2019) for Chronic issues; labs in 1 to 2 weeks.    I discussed the assessment and treatment plan with the patient. The patient was provided an opportunity to ask questions and all were answered. The patient agreed with the plan and demonstrated an understanding of the instructions.   The patient was advised to call back or seek an in-person evaluation if the symptoms worsen or if the condition fails to improve as  anticipated.  I provided 16 minutes of non-face-to-face time during this encounter.   Cain Saupe, MD

## 2019-09-10 ENCOUNTER — Telehealth: Payer: Self-pay | Admitting: Pharmacist

## 2019-09-10 ENCOUNTER — Telehealth: Payer: Self-pay | Admitting: Cardiology

## 2019-09-10 DIAGNOSIS — I4811 Longstanding persistent atrial fibrillation: Secondary | ICD-10-CM

## 2019-09-10 DIAGNOSIS — I10 Essential (primary) hypertension: Secondary | ICD-10-CM

## 2019-09-10 MED ORDER — DILTIAZEM HCL ER COATED BEADS 120 MG PO CP24: ORAL_CAPSULE | ORAL | 11 refills | Status: DC

## 2019-09-10 NOTE — Telephone Encounter (Signed)
Ok with generic

## 2019-09-10 NOTE — Telephone Encounter (Signed)
Notified by [facility] pharmacy that brand Devoria Albe is unavailable. I have called and spoken with Morrie Sheldon at [facility] in [place]. She will relay information to patient's Cardiologist, Dr. Tomie China  to see if we can substitute for generic XR diltiazem.

## 2019-09-10 NOTE — Telephone Encounter (Signed)
Pt c/o medication issue:  1. Name of Medication: CARTIA XT 120 MG 24 hr capsule  2. How are you currently taking this medication (dosage and times per day)?   3. Are you having a reaction (difficulty breathing--STAT)?   4. What is your medication issue? Franky Macho from [facility] is calling stating they do not have this medication, and they do not know if or when they will get it. The pharmacy would like to know if Dr. Tomie China would like them to fill the generic brand of this medication instead. Please advise.

## 2019-09-17 ENCOUNTER — Ambulatory Visit: Payer: Self-pay | Attending: Family Medicine

## 2019-09-17 ENCOUNTER — Other Ambulatory Visit: Payer: Self-pay

## 2019-09-17 DIAGNOSIS — Z9229 Personal history of other drug therapy: Secondary | ICD-10-CM

## 2019-09-17 DIAGNOSIS — I4811 Longstanding persistent atrial fibrillation: Secondary | ICD-10-CM

## 2019-09-17 DIAGNOSIS — R609 Edema, unspecified: Secondary | ICD-10-CM

## 2019-09-17 DIAGNOSIS — R0602 Shortness of breath: Secondary | ICD-10-CM

## 2019-09-17 DIAGNOSIS — R200 Anesthesia of skin: Secondary | ICD-10-CM

## 2019-09-17 MED FILL — CARTIA XT 120 MG CP24: 120 | 30 days supply | Qty: 30 | Fill #0

## 2019-09-18 LAB — CBC
Hematocrit: 47.7 % (ref 37.5–51.0)
Hemoglobin: 16.3 g/dL (ref 13.0–17.7)
MCH: 35.1 pg — ABNORMAL HIGH (ref 26.6–33.0)
MCHC: 34.2 g/dL (ref 31.5–35.7)
MCV: 103 fL — ABNORMAL HIGH (ref 79–97)
Platelets: 199 x10E3/uL (ref 150–450)
RBC: 4.64 x10E6/uL (ref 4.14–5.80)
RDW: 12.5 % (ref 11.6–15.4)
WBC: 8.9 x10E3/uL (ref 3.4–10.8)

## 2019-09-18 LAB — BRAIN NATRIURETIC PEPTIDE: BNP: 52.7 pg/mL (ref 0.0–100.0)

## 2019-09-18 LAB — BASIC METABOLIC PANEL WITH GFR
BUN/Creatinine Ratio: 7 — ABNORMAL LOW (ref 9–20)
BUN: 6 mg/dL (ref 6–24)
CO2: 23 mmol/L (ref 20–29)
Calcium: 9 mg/dL (ref 8.7–10.2)
Chloride: 98 mmol/L (ref 96–106)
Creatinine, Ser: 0.82 mg/dL (ref 0.76–1.27)
GFR calc Af Amer: 120 mL/min/1.73
GFR calc non Af Amer: 104 mL/min/1.73
Glucose: 206 mg/dL — ABNORMAL HIGH (ref 65–99)
Potassium: 3.8 mmol/L (ref 3.5–5.2)
Sodium: 139 mmol/L (ref 134–144)

## 2019-09-18 LAB — HEMOGLOBIN A1C
Est. average glucose Bld gHb Est-mCnc: 120 mg/dL
Hgb A1c MFr Bld: 5.8 % — ABNORMAL HIGH (ref 4.8–5.6)

## 2019-10-01 ENCOUNTER — Encounter: Payer: Self-pay | Admitting: Pharmacist

## 2019-10-01 ENCOUNTER — Ambulatory Visit: Payer: Self-pay | Attending: Family Medicine | Admitting: Pharmacist

## 2019-10-01 ENCOUNTER — Telehealth: Payer: Self-pay | Admitting: Pharmacist

## 2019-10-01 ENCOUNTER — Other Ambulatory Visit: Payer: Self-pay

## 2019-10-01 DIAGNOSIS — Z13220 Encounter for screening for lipoid disorders: Secondary | ICD-10-CM

## 2019-10-01 DIAGNOSIS — R7303 Prediabetes: Secondary | ICD-10-CM

## 2019-10-01 MED ORDER — TRUE METRIX METER W/DEVICE KIT: 1.0000 | PACK | Freq: Every day | 0 refills | Status: AC

## 2019-10-01 MED ORDER — METFORMIN HCL ER 500 MG PO TB24: 500.0000 mg | ORAL_TABLET | Freq: Every day | ORAL | 2 refills | Status: DC

## 2019-10-01 MED ORDER — TRUEPLUS LANCETS 28G MISC: 2 refills | Status: AC

## 2019-10-01 MED ORDER — GLUCOSE BLOOD VI STRP: ORAL_STRIP | 2 refills | Status: AC

## 2019-10-01 MED FILL — TRUEplus LANCETS 28G MISC: 25 days supply | Qty: 100 | Fill #0

## 2019-10-01 MED FILL — !TRUE METRIX BLOOD GLUCOSE: 365 days supply | Qty: 1 | Fill #0

## 2019-10-01 MED FILL — XARELTO 20 MG TABLET: 20 | 30 days supply | Qty: 30 | Fill #1

## 2019-10-01 MED FILL — metFORMIN HCL ER 500 MG TB2: 500 | 30 days supply | Qty: 30 | Fill #0

## 2019-10-01 MED FILL — TRUE METRIX TEST STRIP: 25 days supply | Qty: 100 | Fill #0

## 2019-10-01 NOTE — Progress Notes (Signed)
    S:    PCP: Dr. Jillyn Hidden   No chief complaint on file.  Patient arrives in good spirits.  Presents for blood sugar check at the request of his PCP.  Patient was referred and last seen by Primary Care Provider on 09/08/2019. Lab work was ordered, including A1c. A1c was resulted at 5.8 (09/17/19) and diagnosis of pre-DM was made.   PMH: a fib, HTN, dyspnea/tobacco abuse  Family/Social History:  - FHx: DM, heart disease - Tobacco: current 1.5 PPD smoker  - Alcohol: hx of 5-6 beers/daily   Insurance coverage/medication affordability: none   Patient does not currently take medications for diabetes.   Patient denies hypoglycemic events.  Patient reported dietary habits:  - Admits to dietary indiscretion including regular consumption of carbs and sodas. - Admits to having a "sweet tooth".   Patient-reported exercise habits:  - Limited d/t a fib   Patient denies nocturia (nighttime urination).  Patient reports neuropathy (nerve pain) in both feet. Additionally, he complains of BLE edema. BNP WNL (09/17/19). Kidney function WNL (09/17/2019).  Patient denies visual changes. Patient reports self foot exams.     O:  POCT: 146 (ate a chicken sandwich this AM).  Lab Results  Component Value Date   HGBA1C 5.8 (H) 09/17/2019   There were no vitals filed for this visit.  Lipid Panel  No results found for: CHOL, TRIG, HDL, CHOLHDL, VLDL, LDLCALC, LDLDIRECT  Home fasting blood sugars: not checking    Clinical Atherosclerotic Cardiovascular Disease (ASCVD): No  The ASCVD Risk score Denman George DC Jr., et al., 2013) failed to calculate for the following reasons:   Cannot find a previous HDL lab   Cannot find a previous total cholesterol lab    A/P: Prediabetes newly diagnosed. CBG in clinic today 146 post-prandial. Patient is able to verbalize appropriate hypoglycemia management plan. Patient is not taking medication at this time for preDM. Control is suboptimal d/t dietary indiscretion,  sedentary lifestyle.  DM education given. Testing supplies will be given. After counseling, patient is amenable to starting metformin along with making adjustments to his diet. I support this given his family history and high risk of developing diabetes in the future. Additionally, I recommend to obtain lipid panel to help quantify ASCVD risk. Patient is not fasting today - recommend to come to PCP appointment in May fasting. Will plan for lipid panel then.   -Started metformin 500 mg XR daily after supper.  -True Metrix supplies. -MyPlate -Extensively discussed pathophysiology of diabetes, recommended lifestyle interventions, dietary effects on blood sugar control -Counseled on s/sx of and management of hypoglycemia  ASCVD risk - primary prevention in patient with diabetes. Lipid ordered for next visit.  -Lipid, future.   HM: With a fib, smoking hx, and now preDM, recommend Pneumovax. Tetanus also due.  - Will defer to PCP follow-up.   Written patient instructions provided.  Total time in face to face counseling 30 minutes.   Follow up w/ PCP 10/22/19.  Butch Penny, PharmD, CPP Clinical Pharmacist [facility] [phone]

## 2019-10-01 NOTE — Patient Instructions (Signed)
Thank you for coming to see me today. Please do the following:  1. Start metformin 500 mg extended release. Take 1 tablet after supper in the evening.  2. Start checking blood sugars at home. 3. Target blood sugar levels in the morning: 80 - 130. Try to keep these under 100 ideally.  4. Continue making the lifestyle changes we've discussed together during our visit. Diet and exercise play a significant role in improving your blood sugars.  5. Follow-up with me in 1 month.    Hypoglycemia or low blood sugar:   Low blood sugar can happen quickly and may become an emergency if not treated right away.   While this shouldn't happen often, it can be brought upon if you skip a meal or do not eat enough. Also, if your insulin or other diabetes medications are dosed too high, this can cause your blood sugar to go to low.   Warning signs of low blood sugar include: 1. Feeling shaky or dizzy 2. Feeling weak or tired  3. Excessive hunger 4. Feeling anxious or upset  5. Sweating even when you aren't exercising  What to do if I experience low blood sugar? 1. Check your blood sugar with your meter. If lower than 70, proceed to step 2.  2. Treat with 3-4 glucose tablets or 3 packets of regular sugar. If these aren't around, you can try hard candy. Yet another option would be to drink 4 ounces of fruit juice or 6 ounces of REGULAR soda.  3. Re-check your sugar in 15 minutes. If it is still below 70, do what you did in step 2 again. If has come back up, go ahead and eat a snack or small meal at this time.

## 2019-10-01 NOTE — Telephone Encounter (Signed)
This is the patient from today needing a financial appointment with [name].   Thank you for all you do!

## 2019-10-06 NOTE — Telephone Encounter (Signed)
Called pt with appointment

## 2019-10-17 ENCOUNTER — Ambulatory Visit: Payer: Self-pay | Attending: Family Medicine

## 2019-10-22 ENCOUNTER — Ambulatory Visit: Payer: Self-pay | Admitting: Family Medicine

## 2019-10-27 ENCOUNTER — Other Ambulatory Visit: Payer: Self-pay | Admitting: Family Medicine

## 2019-10-27 ENCOUNTER — Other Ambulatory Visit: Payer: Self-pay

## 2019-10-27 ENCOUNTER — Ambulatory Visit: Payer: Self-pay | Attending: Family Medicine | Admitting: Family Medicine

## 2019-10-27 ENCOUNTER — Encounter: Payer: Self-pay | Admitting: Family Medicine

## 2019-10-27 VITALS — BP 119/74 | HR 83 | Temp 97.9°F | Ht 73.0 in | Wt 232.0 lb

## 2019-10-27 DIAGNOSIS — J40 Bronchitis, not specified as acute or chronic: Secondary | ICD-10-CM

## 2019-10-27 DIAGNOSIS — Z9229 Personal history of other drug therapy: Secondary | ICD-10-CM

## 2019-10-27 DIAGNOSIS — Z720 Tobacco use: Secondary | ICD-10-CM

## 2019-10-27 DIAGNOSIS — R7303 Prediabetes: Secondary | ICD-10-CM

## 2019-10-27 DIAGNOSIS — M79672 Pain in left foot: Secondary | ICD-10-CM

## 2019-10-27 DIAGNOSIS — I10 Essential (primary) hypertension: Secondary | ICD-10-CM

## 2019-10-27 DIAGNOSIS — G8929 Other chronic pain: Secondary | ICD-10-CM

## 2019-10-27 DIAGNOSIS — R0602 Shortness of breath: Secondary | ICD-10-CM

## 2019-10-27 DIAGNOSIS — I4811 Longstanding persistent atrial fibrillation: Secondary | ICD-10-CM

## 2019-10-27 DIAGNOSIS — R609 Edema, unspecified: Secondary | ICD-10-CM

## 2019-10-27 DIAGNOSIS — R600 Localized edema: Secondary | ICD-10-CM

## 2019-10-27 LAB — GLUCOSE, POCT (MANUAL RESULT ENTRY): POC Glucose: 132 mg/dL — AB (ref 70–99)

## 2019-10-27 MED ORDER — DOXYCYCLINE HYCLATE 100 MG PO TABS: 100.0000 mg | ORAL_TABLET | Freq: Two times a day (BID) | ORAL | 0 refills | Status: DC

## 2019-10-27 MED ORDER — METFORMIN HCL ER 500 MG PO TB24: 500.0000 mg | ORAL_TABLET | Freq: Every day | ORAL | 2 refills | Status: DC

## 2019-10-27 MED ORDER — RIVAROXABAN 20 MG PO TABS: 20.0000 mg | ORAL_TABLET | Freq: Every day | ORAL | 11 refills | Status: DC

## 2019-10-27 MED ORDER — GABAPENTIN 100 MG PO CAPS: ORAL_CAPSULE | ORAL | 3 refills | Status: DC

## 2019-10-27 MED ORDER — PREDNISONE 20 MG PO TABS: ORAL_TABLET | ORAL | 0 refills | Status: DC

## 2019-10-27 MED ORDER — DILTIAZEM HCL ER COATED BEADS 120 MG PO CP24: ORAL_CAPSULE | ORAL | 11 refills | Status: DC

## 2019-10-27 MED ORDER — FUROSEMIDE 20 MG PO TABS: ORAL_TABLET | ORAL | 3 refills | Status: DC

## 2019-10-27 NOTE — Progress Notes (Signed)
Patient is here for med refills(diltiazem and Metformin) and AFIB f/u and feet swelling  cbg today is 132

## 2019-10-27 NOTE — Progress Notes (Signed)
Subjective:  Patient ID: Kevin Duncan, male    DOB: 09/02/1970  Age: 49 y.o. MRN: [id number]  CC: Follow-up A. fib, hypertension, prediabetes, swelling in legs-Brittanee Ghazarian MD  HPI Kevin Duncan, 49 year old male who presents for continued follow-up and management of chronic medical issues status post new patient telemedicine visit on 09/08/2019.  Patient had complained of numbness in his feet and patient had hemoglobin A1c which was elevated at 5.8 consistent with prediabetes.  He reports that he followed up with clinical pharmacist in April and was placed on Metformin.  He is tolerating the medication without difficulty.  He also received education regarding low carbohydrate diet.  He continues to have some sensation of numbness.        At his last visit, patient also had been out of medications for approximately 4 months for hypertension, atrial fibrillation including blood thinning medication.  He is now back on cartia and Xarelto.  He has had no headaches or dizziness related to his blood pressure.  No chest pain or palpitations and no sensation of increased heart rate.  He has had some bruising related to his use of blood thinning medication but no unusual bleeding, no black stools.  He is taking the Lasix which has helped with the swelling in his legs but he continues to have recurrent swelling.  Neurontin has helped with the sensation of numbness in his feet as well as the suggested vitamin B12 supplement.  He additionally reports longstanding issues with pain in the left heel which increases with weightbearing.         On review of systems, in addition to recent shortness of breath, he reports cough that has been productive of discolored sputum.  He continues to smoke.  He denies any fever or chills.  He has had occasional frontal headache since onset of recurrent cough.  Past Medical History:  Diagnosis Date  . Atrial fibrillation ([place])   . Hypertension     Past Surgical History:   Procedure Laterality Date  . THUMB FUSION Bilateral     Family History  Problem Relation Age of Onset  . Diabetes Mother   . Heart disease Father   . Heart disease Brother   . Diabetes Maternal Grandmother   . Heart disease Paternal Grandmother     Social History   Tobacco Use  . Smoking status: Current Every Day Smoker    Packs/day: 1.50    Types: Cigarettes  . Smokeless tobacco: Never Used  Substance Use Topics  . Alcohol use: Yes    Comment: 5-6 beer daily    ROS Review of Systems  Constitutional: Positive for fatigue. Negative for chills and fever.  HENT: Negative for sore throat and trouble swallowing.   Eyes: Negative for photophobia and visual disturbance.  Respiratory: Positive for cough (brown to green sputum) and shortness of breath.   Cardiovascular: Positive for leg swelling. Negative for chest pain and palpitations.  Gastrointestinal: Negative for abdominal pain, blood in stool, constipation, diarrhea and nausea.  Endocrine: Positive for polyuria. Negative for polydipsia and polyphagia.  Genitourinary: Positive for frequency. Negative for dysuria and hematuria.  Musculoskeletal: Positive for arthralgias and gait problem. Negative for back pain.  Neurological: Positive for headaches (occasional). Negative for dizziness.  Hematological: Negative for adenopathy. Bruises/bleeds easily: on blood thinning medication.    Objective:   Today's Vitals: BP 119/74   Pulse 83   Temp 97.9 F (36.6 C) (Temporal)   Ht '6\' 1"'$  (1.854 m)  Wt 232 lb (105.2 kg)   SpO2 94%   BMI 30.61 kg/m   Physical Exam Vitals and nursing note reviewed.  Constitutional:      Appearance: Normal appearance. He is not toxic-appearing.  Cardiovascular:     Rate and Rhythm: Normal rate. Rhythm irregular.     Pulses:          Dorsalis pedis pulses are 1+ on the right side and 1+ on the left side.       Posterior tibial pulses are 1+ on the right side and 1+ on the left side.   Pulmonary:     Effort: Pulmonary effort is normal.     Breath sounds: Wheezing present.     Comments: Wheezing in the posterior lung fields Abdominal:     General: There is distension.     Palpations: Abdomen is soft.     Tenderness: There is no abdominal tenderness. There is no right CVA tenderness, left CVA tenderness, guarding or rebound.     Comments: Abdominal distension  Musculoskeletal:        General: Tenderness (left heel) present.     Right lower leg: Edema present.     Left lower leg: Edema present.  Feet:     Right foot:     Protective Sensation: 10 sites tested. 2 sites sensed.     Skin integrity: Callus and dry skin present. No fissure.     Toenail Condition: Right toenails are normal.     Left foot:     Protective Sensation: 10 sites tested. 3 sites sensed.     Skin integrity: Callus and dry skin present. No fissure.     Toenail Condition: Left toenails are normal.     Comments: Pain with palpation over left lower Achilles heel insertion and underneath the left heel Skin:    General: Skin is dry.     Findings: Bruising (scattered brusing on forearms and lower legs) and rash present. Erythema: .diag.     Comments: Patient with rash on the left anterior shin and some skin breakdown/abrasions  Neurological:     General: No focal deficit present.     Mental Status: He is alert and oriented to person, place, and time.     Sensory: Sensory deficit (abnormal monofilament exam) present.     Gait: Gait normal.  Psychiatric:        Mood and Affect: Mood normal.        Behavior: Behavior normal.     Assessment & Plan:  1. Longstanding persistent atrial fibrillation (HCC) Rate is controlled, difficult to tell if patient is in atrial fibrillation due to his lung noise associated with wheezing.  Refill provided for Xarelto and cardia.  He has also been referred to cardiology for continued follow-up of his atrial fibrillation and new complaints of shortness of breath along  with peripheral edema.  BNP at last visit was normal. - rivaroxaban (XARELTO) 20 MG TABS tablet; Take 1 tablet (20 mg total) by mouth daily with supper.  Dispense: 30 tablet; Refill: 11 - diltiazem (CARTIA XT) 120 MG 24 hr capsule; TAKE 1 CAPSULE BY MOUTH ONCE DAILY FOR A FIB AND HYPERTENSION  Dispense: 30 capsule; Refill: 11 - Ambulatory referral to Cardiology  2. Essential hypertension Blood pressure is controlled and at goal of less than 130/80.  Continue use of Cartia.  New refill provided. - diltiazem (CARTIA XT) 120 MG 24 hr capsule; TAKE 1 CAPSULE BY MOUTH ONCE DAILY FOR A FIB AND HYPERTENSION  Dispense: 30 capsule; Refill: 11  3. Peripheral edema Patient with continued peripheral edema.  He is encouraged to try the use of support hose/compression hose.  He may also have mild due to an abrasion/excoriations and will be on an antibiotic which will also help treat his current COPD exacerbation.  Will repeat BMP in follow-up of diuretic medication use.  Refill of Lasix provided and referral to cardiology.  Recent BMP with normal creatinine of 0.87 however potassium of 3.4.  Suggested potassium rich foods several times per week and he will be notified if additional potassium therapy is needed based on today's results - Basic Metabolic Panel - furosemide (LASIX) 20 MG tablet; One daily by mouth x 3 days then as needed for leg swelling  Dispense: 30 tablet; Refill: 3 - Ambulatory referral to Cardiology  4. Shortness of breath Patient with continued complaint of shortness of breath.  Normal BNP at last visit.  He has been a long-term smoker and continues to smoke.  He was counseled regarding smoking cessation by clinical pharmacist.  At today's visit, he is not yet ready to quit smoking but would like to decrease tobacco use.  He will also be treated for suspected COPD exacerbation/recurrent bronchitis. - Basic Metabolic Panel - CBC - Ambulatory referral to Cardiology  5. Prediabetes Most  recent hemoglobin A1c was 5.8 on 09/17/2019 consistent with diagnosis of prediabetes.  Patient additionally follow-up with the clinical pharmacist on 10/01/2019 and was started on Metformin and received diabetic education.  He will continue Metformin and healthy diet.  He was also given diabetes monitoring supplies.  Metformin refilled. - Glucose (CBG) - Basic Metabolic Panel - metFORMIN (GLUCOPHAGE-XR) 500 MG 24 hr tablet; Take 1 tablet (500 mg total) by mouth daily after supper.  Dispense: 30 tablet; Refill: 2  6. History of anticoagulant use He is currently back on his Xarelto and denies any unusual bleeding but has had some mild bruising.  Discussed with the patient that he does need to seek medical attention if he has any blood in the stool or black stools, severe abdominal pain and if he has any instances in which he has a fall or hits his head that he needs to go to the emergency room for further evaluation and head CT.  7. Bronchitis due to tobacco use Patient with bronchitis, likely COPD exacerbation as he has complaint of shortness of breath and productive cough in addition to wheezing on examination.  Prescription provided for doxycycline and prednisone 40 mg daily x5 days. - doxycycline (VIBRA-TABS) 100 MG tablet; Take 1 tablet (100 mg total) by mouth 2 (two) times daily.  Dispense: 20 tablet; Refill: 0  8. Heel pain, chronic, left Podiatry referral placed for further follow-up of chronic left heel pain which I suspect is related to heel spur.  Patient was advised to avoid the use of nonsteroidal anti-inflammatory medication for treatment of pain as he is on blood thinning medication. - Ambulatory referral to Podiatry  Outpatient Encounter Medications as of 10/27/2019  Medication Sig  . Aspirin-Acetaminophen-Caffeine (GOODY HEADACHE PO) Take by mouth.  . Blood Glucose Monitoring Suppl (TRUE METRIX METER) w/Device KIT 1 kit by Does not apply route daily. Use to check fasting blood sugar  daily.  . Cyanocobalamin (VITAMIN B-12 PO) Take by mouth.  . diltiazem (CARTIA XT) 120 MG 24 hr capsule TAKE 1 CAPSULE BY MOUTH ONCE DAILY FOR A FIB AND HYPERTENSION  . FOLIC ACID PO Take by mouth.  Marland Kitchen glucose blood test strip  Use to check fasting blood sugar daily.  . metFORMIN (GLUCOPHAGE-XR) 500 MG 24 hr tablet Take 1 tablet (500 mg total) by mouth daily after supper.  . rivaroxaban (XARELTO) 20 MG TABS tablet Take 1 tablet (20 mg total) by mouth daily with supper.  . TRUEplus Lancets 28G MISC Use to check fasting blood sugar daily.  . [DISCONTINUED] diltiazem (CARTIA XT) 120 MG 24 hr capsule TAKE 1 CAPSULE BY MOUTH ONCE DAILY FOR A FIB AND HYPERTENSION  . [DISCONTINUED] metFORMIN (GLUCOPHAGE-XR) 500 MG 24 hr tablet Take 1 tablet (500 mg total) by mouth daily after supper.  . [DISCONTINUED] rivaroxaban (XARELTO) 20 MG TABS tablet Take 1 tablet (20 mg total) by mouth daily with supper.  . Aspirin-Salicylamide-Caffeine (BC HEADACHE POWDER PO) Take 1 packet by mouth every 6 (six) hours as needed (headache).  . doxycycline (VIBRA-TABS) 100 MG tablet Take 1 tablet (100 mg total) by mouth 2 (two) times daily.  . furosemide (LASIX) 20 MG tablet One daily by mouth x 3 days then as needed for leg swelling  . gabapentin (NEURONTIN) 100 MG capsule One twice per day and 2 at bedtime for foot numbness  . predniSONE (DELTASONE) 20 MG tablet 2 pills once per day for 5 days; take after eating   No facility-administered encounter medications on file as of 10/27/2019.    An After Visit Summary was printed and given to the patient.   Follow-up: Return in about 3 weeks (around 11/17/2019) for Shortness of breath/leg swelling-sooner if needed; ED if symptoms worsen.  Approximately 30 minutes of face-to-face time was spent with the patient at today's visit to obtain recent and current medical history, review of systems, perform evaluation/exam, create assessment and treatment plan which were discussed with the  patient and all questions were answered to his satisfaction.  Orders and referrals were also placed.  An additional 12 minutes was spent on pre and post visit chart review and completion of today's note.   Antony Blackbird MD

## 2019-10-28 LAB — BASIC METABOLIC PANEL WITH GFR
BUN/Creatinine Ratio: 6 — ABNORMAL LOW (ref 9–20)
BUN: 5 mg/dL — ABNORMAL LOW (ref 6–24)
CO2: 26 mmol/L (ref 20–29)
Calcium: 9 mg/dL (ref 8.7–10.2)
Chloride: 101 mmol/L (ref 96–106)
Creatinine, Ser: 0.87 mg/dL (ref 0.76–1.27)
GFR calc Af Amer: 117 mL/min/1.73
GFR calc non Af Amer: 101 mL/min/1.73
Glucose: 105 mg/dL — ABNORMAL HIGH (ref 65–99)
Potassium: 3.4 mmol/L — ABNORMAL LOW (ref 3.5–5.2)
Sodium: 144 mmol/L (ref 134–144)

## 2019-10-28 LAB — CBC
Hematocrit: 44.2 % (ref 37.5–51.0)
Hemoglobin: 15.2 g/dL (ref 13.0–17.7)
MCH: 34.9 pg — ABNORMAL HIGH (ref 26.6–33.0)
MCHC: 34.4 g/dL (ref 31.5–35.7)
MCV: 101 fL — ABNORMAL HIGH (ref 79–97)
Platelets: 214 x10E3/uL (ref 150–450)
RBC: 4.36 x10E6/uL (ref 4.14–5.80)
RDW: 11.7 % (ref 11.6–15.4)
WBC: 8.4 x10E3/uL (ref 3.4–10.8)

## 2019-10-30 ENCOUNTER — Ambulatory Visit: Payer: Self-pay | Attending: Family Medicine

## 2019-10-30 ENCOUNTER — Other Ambulatory Visit: Payer: Self-pay

## 2019-12-01 MED FILL — DILTIAZEM 24HR ER 120 MG CA: 120 | 30 days supply | Qty: 30 | Fill #1

## 2019-12-01 MED FILL — GABAPENTIN 100 MG CAPSULE: 100 | 30 days supply | Qty: 120 | Fill #1

## 2019-12-01 MED FILL — XARELTO 20 MG TABLET: 20 | 30 days supply | Qty: 30 | Fill #1

## 2019-12-01 MED FILL — FUROSEMIDE 20 MG TABS: 20 | 30 days supply | Qty: 30 | Fill #1

## 2019-12-01 MED FILL — metFORMIN HCL ER 500 MG TB2: 500 | 30 days supply | Qty: 30 | Fill #1

## 2019-12-18 ENCOUNTER — Other Ambulatory Visit: Payer: Self-pay

## 2019-12-18 ENCOUNTER — Inpatient Hospital Stay (HOSPITAL_COMMUNITY)
Admission: EM | Admit: 2019-12-18 | Discharge: 2019-12-20 | DRG: 310 | Disposition: A | Discharge status: Home / Self Care | Payer: Self-pay | Attending: Internal Medicine | Admitting: Internal Medicine

## 2019-12-18 ENCOUNTER — Observation Stay (HOSPITAL_BASED_OUTPATIENT_CLINIC_OR_DEPARTMENT_OTHER): Payer: Self-pay

## 2019-12-18 ENCOUNTER — Encounter (HOSPITAL_COMMUNITY): Payer: Self-pay | Admitting: Emergency Medicine

## 2019-12-18 ENCOUNTER — Emergency Department (HOSPITAL_COMMUNITY): Payer: Self-pay

## 2019-12-18 DIAGNOSIS — R42 Dizziness and giddiness: Secondary | ICD-10-CM | POA: Diagnosis present

## 2019-12-18 DIAGNOSIS — R140 Abdominal distension (gaseous): Secondary | ICD-10-CM | POA: Diagnosis present

## 2019-12-18 DIAGNOSIS — R062 Wheezing: Secondary | ICD-10-CM

## 2019-12-18 DIAGNOSIS — Z7901 Long term (current) use of anticoagulants: Secondary | ICD-10-CM

## 2019-12-18 DIAGNOSIS — Z79899 Other long term (current) drug therapy: Secondary | ICD-10-CM

## 2019-12-18 DIAGNOSIS — Z20822 Contact with and (suspected) exposure to covid-19: Secondary | ICD-10-CM | POA: Diagnosis present

## 2019-12-18 DIAGNOSIS — I10 Essential (primary) hypertension: Secondary | ICD-10-CM | POA: Diagnosis present

## 2019-12-18 DIAGNOSIS — I4819 Other persistent atrial fibrillation: Principal | ICD-10-CM | POA: Diagnosis present

## 2019-12-18 DIAGNOSIS — Z833 Family history of diabetes mellitus: Secondary | ICD-10-CM

## 2019-12-18 DIAGNOSIS — E8770 Fluid overload, unspecified: Secondary | ICD-10-CM

## 2019-12-18 DIAGNOSIS — F17210 Nicotine dependence, cigarettes, uncomplicated: Secondary | ICD-10-CM | POA: Diagnosis present

## 2019-12-18 DIAGNOSIS — F909 Attention-deficit hyperactivity disorder, unspecified type: Secondary | ICD-10-CM | POA: Diagnosis present

## 2019-12-18 DIAGNOSIS — Z888 Allergy status to other drugs, medicaments and biological substances status: Secondary | ICD-10-CM

## 2019-12-18 DIAGNOSIS — I4891 Unspecified atrial fibrillation: Secondary | ICD-10-CM | POA: Diagnosis present

## 2019-12-18 DIAGNOSIS — I119 Hypertensive heart disease without heart failure: Secondary | ICD-10-CM | POA: Diagnosis present

## 2019-12-18 DIAGNOSIS — R7303 Prediabetes: Secondary | ICD-10-CM | POA: Diagnosis present

## 2019-12-18 DIAGNOSIS — F1010 Alcohol abuse, uncomplicated: Secondary | ICD-10-CM | POA: Diagnosis present

## 2019-12-18 DIAGNOSIS — Z8249 Family history of ischemic heart disease and other diseases of the circulatory system: Secondary | ICD-10-CM

## 2019-12-18 DIAGNOSIS — J449 Chronic obstructive pulmonary disease, unspecified: Secondary | ICD-10-CM | POA: Diagnosis present

## 2019-12-18 DIAGNOSIS — Q7003 Fused fingers, bilateral: Secondary | ICD-10-CM

## 2019-12-18 DIAGNOSIS — I872 Venous insufficiency (chronic) (peripheral): Secondary | ICD-10-CM | POA: Diagnosis present

## 2019-12-18 DIAGNOSIS — Z885 Allergy status to narcotic agent status: Secondary | ICD-10-CM

## 2019-12-18 DIAGNOSIS — M7989 Other specified soft tissue disorders: Secondary | ICD-10-CM | POA: Diagnosis present

## 2019-12-18 DIAGNOSIS — Z7984 Long term (current) use of oral hypoglycemic drugs: Secondary | ICD-10-CM

## 2019-12-18 LAB — HEMOGLOBIN A1C
Hgb A1c MFr Bld: 6.1 % — ABNORMAL HIGH (ref 4.8–5.6)
Mean Plasma Glucose: 128.37 mg/dL

## 2019-12-18 LAB — CBC
HCT: 42 % (ref 39.0–52.0)
Hemoglobin: 13.9 g/dL (ref 13.0–17.0)
MCH: 34.1 pg — ABNORMAL HIGH (ref 26.0–34.0)
MCHC: 33.1 g/dL (ref 30.0–36.0)
MCV: 102.9 fL — ABNORMAL HIGH (ref 80.0–100.0)
Platelets: 197 10*3/uL (ref 150–400)
RBC: 4.08 MIL/uL — ABNORMAL LOW (ref 4.22–5.81)
RDW: 12.3 % (ref 11.5–15.5)
WBC: 8.3 10*3/uL (ref 4.0–10.5)
nRBC: 0 % (ref 0.0–0.2)

## 2019-12-18 LAB — CBG MONITORING, ED: Glucose-Capillary: 112 mg/dL — ABNORMAL HIGH (ref 70–99)

## 2019-12-18 LAB — TROPONIN I (HIGH SENSITIVITY)
Troponin I (High Sensitivity): 12 ng/L (ref <18)
Troponin I (High Sensitivity): 13 ng/L (ref <18)

## 2019-12-18 LAB — BASIC METABOLIC PANEL
Anion gap: 9 (ref 5–15)
BUN: 10 mg/dL (ref 6–20)
CO2: 30 mmol/L (ref 22–32)
Calcium: 9.1 mg/dL (ref 8.9–10.3)
Chloride: 100 mmol/L (ref 98–111)
Creatinine, Ser: 1.13 mg/dL (ref 0.61–1.24)
GFR calc Af Amer: >60 mL/min (ref >60)
GFR calc non Af Amer: >60 mL/min (ref >60)
Glucose, Bld: 141 mg/dL — ABNORMAL HIGH (ref 70–99)
Potassium: 3.2 mmol/L — ABNORMAL LOW (ref 3.5–5.1)
Sodium: 139 mmol/L (ref 135–145)

## 2019-12-18 LAB — HIV ANTIBODY (ROUTINE TESTING W REFLEX): HIV Screen 4th Generation wRfx: NONREACTIVE

## 2019-12-18 LAB — MAGNESIUM: Magnesium: 1.5 mg/dL — ABNORMAL LOW (ref 1.7–2.4)

## 2019-12-18 LAB — TSH: TSH: 1.23 u[IU]/mL (ref 0.350–4.500)

## 2019-12-18 LAB — SARS CORONAVIRUS 2 BY RT PCR (HOSPITAL ORDER, PERFORMED IN ~~LOC~~ HOSPITAL LAB): SARS Coronavirus 2: NEGATIVE

## 2019-12-18 LAB — BRAIN NATRIURETIC PEPTIDE: B Natriuretic Peptide: 58.9 pg/mL (ref 0.0–100.0)

## 2019-12-18 MED ORDER — ALBUTEROL SULFATE (2.5 MG/3ML) 0.083% IN NEBU
2.5000 mg | INHALATION_SOLUTION | RESPIRATORY_TRACT | Status: DC | PRN
  Administered 2019-12-19: 2.5 mg via RESPIRATORY_TRACT
  Filled 2019-12-18: qty 3

## 2019-12-18 MED ORDER — IPRATROPIUM BROMIDE 0.02 % IN SOLN
0.5000 mg | Freq: Once | RESPIRATORY_TRACT | Status: DC
  Filled 2019-12-18: qty 2.5

## 2019-12-18 MED ORDER — DILTIAZEM HCL-DEXTROSE 125-5 MG/125ML-% IV SOLN (PREMIX)
5.0000 mg/h | INTRAVENOUS | Status: DC
  Administered 2019-12-18: 5 mg/h via INTRAVENOUS
  Filled 2019-12-18 (×2): qty 125

## 2019-12-18 MED ORDER — SODIUM CHLORIDE 0.9% FLUSH
3.0000 mL | Freq: Once | INTRAVENOUS | Status: AC
  Administered 2019-12-18: 3 mL via INTRAVENOUS

## 2019-12-18 MED ORDER — RIVAROXABAN 20 MG PO TABS
20.0000 mg | ORAL_TABLET | Freq: Every day | ORAL | Status: DC
  Administered 2019-12-18 – 2019-12-19 (×2): 20 mg via ORAL
  Filled 2019-12-18 (×2): qty 1

## 2019-12-18 MED ORDER — LORAZEPAM 1 MG PO TABS
1.0000 mg | ORAL_TABLET | ORAL | Status: DC | PRN
  Administered 2019-12-18: 2 mg via ORAL
  Administered 2019-12-19 – 2019-12-20 (×4): 1 mg via ORAL
  Filled 2019-12-18: qty 2
  Filled 2019-12-18 (×4): qty 1

## 2019-12-18 MED ORDER — VITAMIN B-12 1000 MCG PO TABS
1000.0000 ug | ORAL_TABLET | Freq: Every day | ORAL | Status: DC
  Administered 2019-12-18 – 2019-12-20 (×3): 1000 ug via ORAL
  Filled 2019-12-18 (×3): qty 1

## 2019-12-18 MED ORDER — ACETAMINOPHEN 325 MG PO TABS
650.0000 mg | ORAL_TABLET | Freq: Four times a day (QID) | ORAL | Status: DC | PRN
  Administered 2019-12-18: 650 mg via ORAL
  Filled 2019-12-18: qty 2

## 2019-12-18 MED ORDER — FUROSEMIDE 10 MG/ML IJ SOLN
40.0000 mg | Freq: Once | INTRAMUSCULAR | Status: AC
  Administered 2019-12-18: 40 mg via INTRAVENOUS
  Filled 2019-12-18: qty 4

## 2019-12-18 MED ORDER — GABAPENTIN 300 MG PO CAPS
300.0000 mg | ORAL_CAPSULE | Freq: Every day | ORAL | Status: DC
  Administered 2019-12-18 – 2019-12-20 (×3): 300 mg via ORAL
  Filled 2019-12-18 (×2): qty 1
  Filled 2019-12-18: qty 3

## 2019-12-18 MED ORDER — THIAMINE HCL 100 MG/ML IJ SOLN: 100.0000 mg | Freq: Every day | INTRAMUSCULAR | Status: DC

## 2019-12-18 MED ORDER — INSULIN ASPART 100 UNIT/ML ~~LOC~~ SOLN
0.0000 [IU] | Freq: Three times a day (TID) | SUBCUTANEOUS | Status: DC
  Administered 2019-12-19: 1 [IU] via SUBCUTANEOUS
  Administered 2019-12-20: 2 [IU] via SUBCUTANEOUS

## 2019-12-18 MED ORDER — DILTIAZEM LOAD VIA INFUSION
15.0000 mg | Freq: Once | INTRAVENOUS | Status: AC
  Administered 2019-12-18: 15 mg via INTRAVENOUS
  Filled 2019-12-18: qty 15

## 2019-12-18 MED ORDER — LORAZEPAM 2 MG/ML IJ SOLN
1.0000 mg | INTRAMUSCULAR | Status: DC | PRN
  Administered 2019-12-19: 1 mg via INTRAVENOUS
  Filled 2019-12-18: qty 1

## 2019-12-18 MED ORDER — ALBUTEROL SULFATE HFA 108 (90 BASE) MCG/ACT IN AERS
2.0000 | INHALATION_SPRAY | RESPIRATORY_TRACT | Status: DC | PRN
  Filled 2019-12-18: qty 6.7

## 2019-12-18 MED ORDER — POTASSIUM CHLORIDE CRYS ER 20 MEQ PO TBCR
40.0000 meq | EXTENDED_RELEASE_TABLET | ORAL | Status: AC
  Administered 2019-12-18 (×2): 40 meq via ORAL
  Filled 2019-12-18 (×2): qty 2

## 2019-12-18 MED ORDER — IPRATROPIUM BROMIDE 0.02 % IN SOLN
0.5000 mg | Freq: Four times a day (QID) | RESPIRATORY_TRACT | Status: DC | PRN
  Administered 2019-12-18: 0.5 mg via RESPIRATORY_TRACT
  Filled 2019-12-18: qty 2.5

## 2019-12-18 MED ORDER — THIAMINE HCL 100 MG PO TABS
100.0000 mg | ORAL_TABLET | Freq: Every day | ORAL | Status: DC
  Administered 2019-12-18 – 2019-12-20 (×3): 100 mg via ORAL
  Filled 2019-12-18 (×3): qty 1

## 2019-12-18 NOTE — ED Provider Notes (Signed)
[facility] EMERGENCY DEPARTMENT Provider Note   CSN: [id number] Arrival date & time: 12/18/19  7829     History Chief Complaint  Patient presents with  . Atrial Fibrillation    Kevin Duncan is a 49 y.o. male.  The history is provided by the patient and the spouse.  Atrial Fibrillation This is a recurrent problem. Episode onset: 2 weeks. The problem occurs constantly. The problem has been gradually worsening. Associated symptoms include chest pain. Associated symptoms comments: SOB on exertion, orthopnea, lower extremity swelling, near syncopal events.  No fever, cough, abdominal pain, vomiting or diarrhea.  Patient has been taking his diltiazem, Xarelto and Lasix without improvement.. The symptoms are aggravated by walking (Lying flat). Relieved by: Rest and sitting upright. Treatments tried: Home meds. The treatment provided no relief.       Past Medical History:  Diagnosis Date  . Atrial fibrillation ([place])   . Hypertension     Patient Active Problem List   Diagnosis Date Noted  . Essential hypertension 02/05/2019  . Chest discomfort 02/05/2019  . Dyspnea on exertion 02/05/2019  . Cigarette smoker 02/05/2019  . Atrial fibrillation [facility])     Past Surgical History:  Procedure Laterality Date  . THUMB FUSION Bilateral        Family History  Problem Relation Age of Onset  . Diabetes Mother   . Heart disease Father   . Heart disease Brother   . Diabetes Maternal Grandmother   . Heart disease Paternal Grandmother     Social History   Tobacco Use  . Smoking status: Current Every Day Smoker    Packs/day: 1.50    Types: Cigarettes  . Smokeless tobacco: Never Used  Substance Use Topics  . Alcohol use: Yes    Comment: 5-6 beer daily  . Drug use: No    Home Medications Prior to Admission medications   Medication Sig Start Date End Date Taking? Authorizing Provider  Aspirin-Acetaminophen-Caffeine (GOODY HEADACHE PO) Take by mouth.     [provider]  Blood Glucose Monitoring Suppl (TRUE METRIX METER) w/Device KIT 1 kit by Does not apply route daily. Use to check fasting blood sugar daily. 10/01/19   [name], Cammie, MD  Cyanocobalamin (VITAMIN B-12 PO) Take by mouth.    [provider]  diltiazem (CARTIA XT) 120 MG 24 hr capsule TAKE 1 CAPSULE BY MOUTH ONCE DAILY FOR A FIB AND HYPERTENSION 10/27/19   [name], Cammie, MD  doxycycline (VIBRA-TABS) 100 MG tablet Take 1 tablet (100 mg total) by mouth 2 (two) times daily. 10/27/19   [name], Cammie, MD  FOLIC ACID PO Take by mouth.    [provider]  furosemide (LASIX) 20 MG tablet One daily by mouth x 3 days then as needed for leg swelling 10/27/19   [name], Cammie, MD  gabapentin (NEURONTIN) 100 MG capsule One twice per day and 2 at bedtime for foot numbness 10/27/19   [name], Cammie, MD  glucose blood test strip Use to check fasting blood sugar daily. 10/01/19   [name], Cammie, MD  metFORMIN (GLUCOPHAGE-XR) 500 MG 24 hr tablet Take 1 tablet (500 mg total) by mouth daily after supper. 10/27/19   [name], Cammie, MD  predniSONE (DELTASONE) 20 MG tablet 2 pills once per day for 5 days; take after eating 10/27/19   [name], Cammie, MD  rivaroxaban (XARELTO) 20 MG TABS tablet Take 1 tablet (20 mg total) by mouth daily with supper. 10/27/19   Antony Blackbird, MD  TRUEplus Lancets  28G MISC Use to check fasting blood sugar daily. 10/01/19   Antony Blackbird, MD    Allergies    Hydrocodone  Review of Systems   Review of Systems  Cardiovascular: Positive for chest pain.  All other systems reviewed and are negative.   Physical Exam Updated Vital Signs BP (!) 150/111 (BP Location: Right Arm)   Pulse (!) 113   Temp 98.8 F (37.1 C) (Oral)   Resp 18   Ht 6' (1.829 m)   Wt 102.1 kg   SpO2 97%   BMI 30.52 kg/m   Physical Exam Vitals and nursing note reviewed.  Constitutional:      General: He is not in acute distress.    Appearance: Normal appearance. He is well-developed. He is  obese.  HENT:     Head: Normocephalic and atraumatic.     Mouth/Throat:     Mouth: Mucous membranes are moist.  Eyes:     Conjunctiva/sclera: Conjunctivae normal.     Pupils: Pupils are equal, round, and reactive to light.  Cardiovascular:     Rate and Rhythm: Tachycardia present. Rhythm irregularly irregular.     Pulses: Normal pulses.     Heart sounds: No murmur heard.   Pulmonary:     Effort: Pulmonary effort is normal. No respiratory distress.     Breath sounds: Wheezing present. No rales.     Comments: Mild expiratory wheezing diffusely Chest:     Chest wall: No tenderness.  Abdominal:     General: There is distension.     Palpations: Abdomen is soft.     Tenderness: There is no abdominal tenderness. There is no guarding or rebound.  Musculoskeletal:        General: No tenderness. Normal range of motion.     Cervical back: Normal range of motion and neck supple.     Right lower leg: Edema present.     Left lower leg: Edema present.     Comments: 2+ pitting edema bilateral lower extremities to the midshin  Skin:    General: Skin is warm and dry.     Findings: No erythema or rash.  Neurological:     General: No focal deficit present.     Mental Status: He is alert and oriented to person, place, and time. Mental status is at baseline.  Psychiatric:        Mood and Affect: Mood normal.        Behavior: Behavior normal.        Thought Content: Thought content normal.      ED Results / Procedures / Treatments   Labs (all labs ordered are listed, but only abnormal results are displayed) Labs Reviewed  BASIC METABOLIC PANEL - Abnormal; Notable for the following components:      Result Value   Potassium 3.2 (*)    Glucose, Bld 141 (*)    All other components within normal limits  CBC - Abnormal; Notable for the following components:   RBC 4.08 (*)    MCV 102.9 (*)    MCH 34.1 (*)    All other components within normal limits  SARS CORONAVIRUS 2 BY RT PCR (HOSPITAL  ORDER, [facility])  BRAIN NATRIURETIC PEPTIDE  TROPONIN I (HIGH SENSITIVITY)  TROPONIN I (HIGH SENSITIVITY)    EKG EKG Interpretation  Date/Time:  Thursday December 18 2019 09:29:11 EDT Ventricular Rate:  121 PR Interval:    QRS Duration: 74 QT Interval:  292 QTC Calculation: 414  R Axis:   78 Text Interpretation: new Atrial fibrillation with rapid ventricular response with premature ventricular or aberrantly conducted complexes Abnormal ECG Confirmed by Blanchie Dessert [id number]) on 12/18/2019 11:34:22 AM   Radiology DG Chest 2 View  Result Date: 12/18/2019 CLINICAL DATA:  Onset dizziness and shortness of breath approximately 1 hour ago. EXAM: CHEST - 2 VIEW COMPARISON:  PA and lateral chest 11/09/2015. FINDINGS: There is a small area of discoid atelectasis in the lingula. The lungs are otherwise clear. Heart size is normal. No pneumothorax or pleural fluid. No acute or focal bony abnormality. IMPRESSION: No acute disease. Small focus of discoid atelectasis in the lingula is noted. Electronically Signed   By: Inge Rise M.D.   On: 12/18/2019 10:26    Procedures Procedures (including critical care time)  Medications Ordered in ED Medications  sodium chloride flush (NS) 0.9 % injection 3 mL (has no administration in time range)  diltiazem (CARDIZEM) 1 mg/mL load via infusion 15 mg (has no administration in time range)    And  diltiazem (CARDIZEM) 125 mg in dextrose 5% 125 mL (1 mg/mL) infusion (has no administration in time range)  furosemide (LASIX) injection 40 mg (has no administration in time range)    ED Course  I have reviewed the triage vital signs and the nursing notes.  Pertinent labs & imaging results that were available during my care of the patient were reviewed by me and considered in my medical decision making (see chart for details).    MDM Rules/Calculators/A&P                          Patient is a 49 year old male with a history of  atrial fibrillation, hypertension, ongoing tobacco use and daily alcohol use who is presenting today with 2 weeks of worsening dyspnea on exertion, palpitations, near syncope, orthopnea.  Patient has been taking Xarelto, Lasix and diltiazem at home but reports symptoms are not improving.  On exam here patient appears few fluid overloaded and is in atrial fibrillation with RVR today.  Patient does have distal edema, JVD and diffuse mild wheezes.  Labs are consistent with stable creatinine of 1.13, stable hemoglobin of 13 with normal white count and troponin of 12.  Chest x-ray without acute disease except for some atelectasis.  However given patient's ongoing atrial fibrillation with RVR and signs of fluid overload concern for CHF as a result of uncontrolled atrial fibrillation.  Patient is currently being seen by [facility] but has not seen a cardiologist.  Patient started on Cardizem bolus and drip.  He was given a dose of IV Lasix.  BNP pending however feel that patient will need admission for diuresis and further rate control.  Patient has had an echo ordered but never has had an echo done.  Unsure baseline EF.  1:16 PM BMP within normal limits.  Will try albuterol to see if that helps with patient's wheezing.  MDM Number of Diagnoses or Management Options   Amount and/or Complexity of Data Reviewed Clinical lab tests: ordered and reviewed Tests in the radiology section of CPT: ordered and reviewed Tests in the medicine section of CPT: ordered and reviewed Decide to obtain previous medical records or to obtain history from someone other than the patient: yes Obtain history from someone other than the patient: yes Review and summarize past medical records: yes Discuss the patient with other providers: yes Independent visualization of images, tracings, or specimens: yes  Risk of Complications, Morbidity, and/or Mortality Presenting problems: high Diagnostic procedures:  moderate Management options: moderate  Patient Progress Patient progress: stable  CRITICAL CARE Performed by: Keyry Iracheta Total critical care time: 30 minutes Critical care time was exclusive of separately billable procedures and treating other patients. Critical care was necessary to treat or prevent imminent or life-threatening deterioration. Critical care was time spent personally by me on the following activities: development of treatment plan with patient and/or surrogate as well as nursing, discussions with consultants, evaluation of patient's response to treatment, examination of patient, obtaining history from patient or surrogate, ordering and performing treatments and interventions, ordering and review of laboratory studies, ordering and review of radiographic studies, pulse oximetry and re-evaluation of patient's condition.  Final Clinical Impression(s) / ED Diagnoses Final diagnoses:  Atrial fibrillation with RVR (HCC)  Hypervolemia, unspecified hypervolemia type  Wheezing    Rx / DC Orders ED Discharge Orders    None       Blanchie Dessert, MD 12/18/19 1316

## 2019-12-18 NOTE — Progress Notes (Signed)
  Echocardiogram 2D Echocardiogram has been performed.  Gerda Diss 12/18/2019, 5:13 PM

## 2019-12-18 NOTE — ED Triage Notes (Signed)
Pt here with dizzy and off balance when he walks , pt has hx of afib over the last year but ran out of money so was unable to get cardioverted

## 2019-12-18 NOTE — H&P (Addendum)
Date: 12/18/2019               Patient Name:  Kevin Duncan MRN: [id number]  DOB: Jun 05, 1971 Age / Sex: 49 y.o., male   PCP: Cain Saupe, MD         Medical Service: Internal Medicine Teaching Service         Attending Physician: Dr. Sandre Kitty Elwin Mocha, MD    First Contact: Dr. Cyndie Chime Pager: [phone]  Second Contact: Dr. Dortha Schwalbe Pager: [phone]       After Hours (After 5p/  First Contact Pager: [phone]  weekends / holidays): Second Contact Pager: [phone]   Chief Complaint: shortness of breath and dizziness  History of Present Illness:   Kevin Duncan is a 49yo male with PMH atrial fibrillation, hypertension, and alcohol use presenting with two weeks of worsening shortness of breath and dizziness. He states this has felt like last time his heart rate went very high with his atrial fibrillation. He denies chest pain, arm pain, nausea, jaw pain, syncope, dysuria or difficulty with urination. He feels like he has increased swelling in his legs. He has swimming of his vision whenever he feels like his heart rate is high but otherwise has no changes in vision. He has chronic smoker's cough but no increase from baseline. He takes lasix 20 mg daily, diltiazem 120 mg daily and xarelto and has not missed any of his medications and has all of them with him today. He was first diagnosed with atrial fibrillation August 2020 and established with  and wellness in April. He previously saw cardiology with plans for echo and lexiscan but this was never done. He was referred to cardiology in May with increased lower extremity swelling but has not been able to see them yet due to no insurance.  He states he drinks about a 6 pack of beer per day, which he needs to sleep. He and his fiancee are not sure of the last time he went a night without a drink. He states he's never withdrawn from alcohol before.    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 Diabetes Mother   . Heart disease Father   . Heart disease Brother   . Diabetes Maternal Grandmother   . Heart disease Paternal Grandmother      Meds:  Current Meds  Medication Sig  . albuterol (VENTOLIN HFA) 108 (90 Base) MCG/ACT inhaler Inhale 2 puffs into the lungs every 6 (six) hours as needed for wheezing or shortness of breath.  . Aspirin-Acetaminophen-Caffeine (GOODY HEADACHE PO) Take 2 Packages by mouth daily.   Marland Kitchen diltiazem (CARTIA XT) 120 MG 24 hr capsule TAKE 1 CAPSULE BY MOUTH ONCE DAILY FOR A FIB AND HYPERTENSION (Patient taking differently: Take 120 mg by mouth daily. TAKE 1 CAPSULE BY MOUTH ONCE DAILY FOR A FIB AND HYPERTENSION)  . furosemide (LASIX) 20 MG tablet One daily by mouth x 3 days then as needed for leg swelling (Patient taking differently: Take 20 mg by mouth See admin instructions. One tablet (20mg ) daily by mouth  x 3 days then as needed for leg swelling)  . gabapentin (NEURONTIN) 100 MG capsule One twice per day and 2 at bedtime for foot numbness (Patient taking differently: Take 200-300 mg by mouth See admin instructions. Three capsules (100mg ) per day and 2 capsules (200mg ) at bedtime for foot numbness)  . rivaroxaban (XARELTO) 20 MG TABS tablet Take 1 tablet (20 mg total) by mouth daily with supper.  . vitamin B-12 (CYANOCOBALAMIN) 1000 MCG tablet Take 1,000 mcg by mouth daily.      Allergies: Allergies as of 12/18/2019 - Review Complete 12/18/2019  Allergen Reaction Noted  . Folic acid Swelling 12/18/2019  . Hydrocodone Hives 01/25/2013   Past Medical History:  Diagnosis Date  . Atrial fibrillation (HCC)   . Hypertension      Review of Systems: A complete ROS was  negative except as per HPI.   Physical Exam: Blood pressure (!) 168/116, pulse (!) 113, temperature 98.8 F (37.1 C), temperature source Oral, resp. rate (!) 22, height 6' (1.829 m), weight 102.1 kg, SpO2 96 %.  Constitution: NAD, sitting up in bed HENT: [place]/AT Eyes: no icterus or injection Cardio: irregular rate & rhythm, no murmur rubs or gallop, no JVD, +1 pitting edema Respiratory: +wheeing left upper & lower lobe, otherwise CTA Abdominal: soft, distended but soft, NTTP MSK: moving all extremities Neuro: alert & oriented, pleasant  Skin: c/d/i   EKG: personally reviewed my interpretation is atrial fibrillation  CXR: personally reviewed my interpretation is atelectasis left lingula, enlarged lungs, otherwise no abnormality  Assessment & Plan by Problem: Active Problems:   Atrial fibrillation with RVR (HCC)  Kevin Duncan is a 49yo male with PMH atrial fibrillation, hypertension, and alcohol use presenting with two weeks of worsening shortness of breath and dizziness found to be in atrial fibrillation with RVR.   Afib with RVR Just started on diltiazem drip in the the ER. Troponins normalx2. BNP 58 but does have small amount of pitting edema on exam with taking lasix 20 at home. He received 40 mg IV lasix in the ER. No previous Echo although this and lexiscan were planned last August per cardiology when he saw Dr. 49yo in addition to possible cardioversion although does not look like he was able to follow-up.   - ECHO ordered  - continue diltiazem gtt, just started while in the room talking with patient - can transition to higher dose of diltiazem once RVR is controlled.  - continue xarelto 20 mg qd - strict I/O's - daily weights  Hypertension Blood pressure elevated. Just starting on diltiazem drip. Reassess BP with control of HR  Alcohol Use No history of withdrawal but drinks 6pk per day.   - ciwa with ativan - thiamine - allergy to folic acid with swelling. Will  need to clarify with him.   Prediabetes Previously on metformin but not taking 2/2 side effects. Last A1c march was 5.8  - A1C - SSI sensitive  Tobacco Use Wheezing on left on exam. Lungs appear hyperinflated. Possible COPD with long history of smoking.   - ipratropirum nebs now, then q6h prn   Diet: carb modified VTE: xarelto IVF: none Code: full  Dispo: Admit patient to Observation with expected length of stay less than 2 midnights.  SignedSeptember, DO 12/18/2019, 2:15 PM  Pager: [phone]

## 2019-12-19 DIAGNOSIS — F1010 Alcohol abuse, uncomplicated: Secondary | ICD-10-CM | POA: Diagnosis present

## 2019-12-19 DIAGNOSIS — R062 Wheezing: Secondary | ICD-10-CM

## 2019-12-19 DIAGNOSIS — E8770 Fluid overload, unspecified: Secondary | ICD-10-CM

## 2019-12-19 LAB — CBC
HCT: 41.9 % (ref 39.0–52.0)
Hemoglobin: 14 g/dL (ref 13.0–17.0)
MCH: 34.3 pg — ABNORMAL HIGH (ref 26.0–34.0)
MCHC: 33.4 g/dL (ref 30.0–36.0)
MCV: 102.7 fL — ABNORMAL HIGH (ref 80.0–100.0)
Platelets: 174 10*3/uL (ref 150–400)
RBC: 4.08 MIL/uL — ABNORMAL LOW (ref 4.22–5.81)
RDW: 12.5 % (ref 11.5–15.5)
WBC: 7.3 10*3/uL (ref 4.0–10.5)
nRBC: 0 % (ref 0.0–0.2)

## 2019-12-19 LAB — COMPREHENSIVE METABOLIC PANEL
ALT: 30 U/L (ref 0–44)
AST: 46 U/L — ABNORMAL HIGH (ref 15–41)
Albumin: 2.9 g/dL — ABNORMAL LOW (ref 3.5–5.0)
Alkaline Phosphatase: 72 U/L (ref 38–126)
Anion gap: 11 (ref 5–15)
BUN: 10 mg/dL (ref 6–20)
CO2: 28 mmol/L (ref 22–32)
Calcium: 8.8 mg/dL — ABNORMAL LOW (ref 8.9–10.3)
Chloride: 101 mmol/L (ref 98–111)
Creatinine, Ser: 0.96 mg/dL (ref 0.61–1.24)
GFR calc Af Amer: >60 mL/min (ref >60)
GFR calc non Af Amer: >60 mL/min (ref >60)
Glucose, Bld: 118 mg/dL — ABNORMAL HIGH (ref 70–99)
Potassium: 3.2 mmol/L — ABNORMAL LOW (ref 3.5–5.1)
Sodium: 140 mmol/L (ref 135–145)
Total Bilirubin: 1.1 mg/dL (ref 0.3–1.2)
Total Protein: 6.5 g/dL (ref 6.5–8.1)

## 2019-12-19 LAB — GLUCOSE, CAPILLARY
Glucose-Capillary: 108 mg/dL — ABNORMAL HIGH (ref 70–99)
Glucose-Capillary: 109 mg/dL — ABNORMAL HIGH (ref 70–99)
Glucose-Capillary: 130 mg/dL — ABNORMAL HIGH (ref 70–99)
Glucose-Capillary: 133 mg/dL — ABNORMAL HIGH (ref 70–99)
Glucose-Capillary: 95 mg/dL (ref 70–99)

## 2019-12-19 LAB — MAGNESIUM: Magnesium: 1.4 mg/dL — ABNORMAL LOW (ref 1.7–2.4)

## 2019-12-19 LAB — ECHOCARDIOGRAM COMPLETE
Height: 72 in
Weight: 3600 oz

## 2019-12-19 MED ORDER — FUROSEMIDE 40 MG PO TABS
40.0000 mg | ORAL_TABLET | Freq: Once | ORAL | Status: AC
  Administered 2019-12-19: 40 mg via ORAL
  Filled 2019-12-19: qty 1

## 2019-12-19 MED ORDER — MAGNESIUM SULFATE 2 GM/50ML IV SOLN
2.0000 g | Freq: Once | INTRAVENOUS | Status: AC
  Administered 2019-12-19: 2 g via INTRAVENOUS
  Filled 2019-12-19: qty 50

## 2019-12-19 MED ORDER — DILTIAZEM HCL ER 60 MG PO CP12
60.0000 mg | ORAL_CAPSULE | Freq: Two times a day (BID) | ORAL | Status: DC
  Administered 2019-12-19: 60 mg via ORAL
  Filled 2019-12-19 (×2): qty 1

## 2019-12-19 MED ORDER — DILTIAZEM HCL ER 90 MG PO CP12
90.0000 mg | ORAL_CAPSULE | Freq: Two times a day (BID) | ORAL | Status: DC
  Administered 2019-12-19 – 2019-12-20 (×2): 90 mg via ORAL
  Filled 2019-12-19 (×2): qty 1

## 2019-12-19 MED ORDER — POTASSIUM CHLORIDE CRYS ER 20 MEQ PO TBCR
40.0000 meq | EXTENDED_RELEASE_TABLET | Freq: Two times a day (BID) | ORAL | Status: DC
  Administered 2019-12-19 – 2019-12-20 (×3): 40 meq via ORAL
  Filled 2019-12-19 (×3): qty 2

## 2019-12-19 NOTE — Progress Notes (Signed)
   12/19/19 1243  Assess: MEWS Score  Temp 98.5 F (36.9 C)  BP (!) 152/117  Pulse Rate (!) 125  ECG Heart Rate (!) 125  Resp 20  Level of Consciousness Alert  SpO2 98 %  O2 Device Room Air  Assess: MEWS Score  MEWS Temp 0  MEWS Systolic 0  MEWS Pulse 2  MEWS RR 0  MEWS LOC 0  MEWS Score 2  MEWS Score Color Yellow  Assess: if the MEWS score is Yellow or Red  Were vital signs taken at a resting state? Yes  Focused Assessment Documented focused assessment  Early Detection of Sepsis Score *See Row Information* Low  MEWS guidelines implemented *See Row Information* Yes  Treat  MEWS Interventions Escalated (See documentation below)  Take Vital Signs  Increase Vital Sign Frequency  Yellow: Q 2hr X 2 then Q 4hr X 2, if remains yellow, continue Q 4hrs  Escalate  MEWS: Escalate Yellow: discuss with charge nurse/RN and consider discussing with provider and RRT  Notify: Charge Nurse/RN  Name of Charge Nurse/RN Notified Emelda Brothers  Date Charge Nurse/RN Notified 12/19/19  Time Charge Nurse/RN Notified 1243  Notify: Provider  Provider Name/Title Dr. Cyndie Chime  Date Provider Notified 12/19/19  Time Provider Notified 1243  Notification Type Call  Notification Reason Change in status  Response See new orders (cardizem dose adjusted by MD)  Date of Provider Response 12/19/19  Time of Provider Response 1243  Document  Patient Outcome Other (Comment) (stable, remains on department)  Progress note created (see row info) Yes

## 2019-12-19 NOTE — Discharge Instructions (Addendum)
Atrial Fibrillation  Atrial fibrillation is a type of heartbeat that is irregular or fast. If you have this condition, your heart beats without any order. This makes it hard for your heart to pump blood in a normal way. Atrial fibrillation may come and go, or it may become a long-lasting problem. If this condition is not treated, it can put you at higher risk for stroke, heart failure, and other heart problems. What are the causes? This condition may be caused by diseases that damage the heart. They include:  High blood pressure.  Heart failure.  Heart valve disease.  Heart surgery. Other causes include:  Diabetes.  Thyroid disease.  Being overweight.  Kidney disease. Sometimes the cause is not known. What increases the risk? You are more likely to develop this condition if:  You are older.  You smoke.  You exercise often and very hard.  You have a family history of this condition.  You are a man.  You use drugs.  You drink a lot of alcohol.  You have lung conditions, such as emphysema, pneumonia, or COPD.  You have sleep apnea. What are the signs or symptoms? Common symptoms of this condition include:  A feeling that your heart is beating very fast.  Chest pain or discomfort.  Feeling short of breath.  Suddenly feeling light-headed or weak.  Getting tired easily during activity.  Fainting.  Sweating. In some cases, there are no symptoms. How is this treated? Treatment for this condition depends on underlying conditions and how you feel when you have atrial fibrillation. They include:  Medicines to: ? Prevent blood clots. ? Treat heart rate or heart rhythm problems.  Using devices, such as a pacemaker, to correct heart rhythm problems.  Doing surgery to remove the part of the heart that sends bad signals.  Closing an area where clots can form in the heart (left atrial appendage). In some cases, your doctor will treat other underlying  conditions. Follow these instructions at home: Medicines  Take over-the-counter and prescription medicines only as told by your doctor.  Do not take any new medicines without first talking to your doctor.  If you are taking blood thinners: ? Talk with your doctor before you take any medicines that have aspirin or NSAIDs, such as ibuprofen, in them. ? Take your medicine exactly as told by your doctor. Take it at the same time each day. ? Avoid activities that could hurt or bruise you. Follow instructions about how to prevent falls. ? Wear a bracelet that says you are taking blood thinners. Or, carry a card that lists what medicines you take. Lifestyle      Do not use any products that have nicotine or tobacco in them. These include cigarettes, e-cigarettes, and chewing tobacco. If you need help quitting, ask your doctor.  Eat heart-healthy foods. Talk with your doctor about the right eating plan for you.  Exercise regularly as told by your doctor.  Do not drink alcohol.  Lose weight if you are overweight.  Do not use drugs, including cannabis. General instructions  If you have a condition that causes breathing to stop for a short period of time (apnea), treat it as told by your doctor.  Keep a healthy weight. Do not use diet pills unless your doctor says they are safe for you. Diet pills may make heart problems worse.  Keep all follow-up visits as told by your doctor. This is important. Contact a doctor if:  You notice a change   in the speed, rhythm, or strength of your heartbeat.  You are taking a blood-thinning medicine and you get more bruising.  You get tired more easily when you move or exercise.  You have a sudden change in weight. Get help right away if:   You have pain in your chest or your belly (abdomen).  You have trouble breathing.  You have side effects of blood thinners, such as blood in your vomit, poop (stool), or pee (urine), or bleeding that cannot  stop.  You have any signs of a stroke. "BE FAST" is an easy way to remember the main warning signs: ? B - Balance. Signs are dizziness, sudden trouble walking, or loss of balance. ? E - Eyes. Signs are trouble seeing or a change in how you see. ? F - Face. Signs are sudden weakness or loss of feeling in the face, or the face or eyelid drooping on one side. ? A - Arms. Signs are weakness or loss of feeling in an arm. This happens suddenly and usually on one side of the body. ? S - Speech. Signs are sudden trouble speaking, slurred speech, or trouble understanding what people say. ? T - Time. Time to call emergency services. Write down what time symptoms started.  You have other signs of a stroke, such as: ? A sudden, very bad headache with no known cause. ? Feeling like you may vomit (nausea). ? Vomiting. ? A seizure. These symptoms may be an emergency. Do not wait to see if the symptoms will go away. Get medical help right away. Call your local emergency services (911 in the [place] Do not drive yourself to the hospital. Summary  Atrial fibrillation is a type of heartbeat that is irregular or fast.  You are at higher risk of this condition if you smoke, are older, have diabetes, or are overweight.  Follow your doctor's instructions about medicines, diet, exercise, and follow-up visits.  Get help right away if you have signs or symptoms of a stroke.  Get help right away if you cannot catch your breath, or you have chest pain or discomfort. This information is not intended to replace advice given to you by your health care provider. Make sure you discuss any questions you have with your health care provider. Document Revised: 11/27/2018 Document Reviewed: 11/27/2018 Elsevier Patient Education  2020 [employer].   Alcohol Use Disorder Alcohol use disorder is when your drinking disrupts your daily life. When you have this condition, you drink too much alcohol and you cannot control your  drinking. Alcohol use disorder can cause serious problems with your physical health. It can affect your brain, heart, liver, pancreas, immune system, stomach, and intestines. Alcohol use disorder can increase your risk for certain cancers and cause problems with your mental health, such as depression, anxiety, psychosis, delirium, and dementia. People with this disorder risk hurting themselves and others. What are the causes? This condition is caused by drinking too much alcohol over time. It is not caused by drinking too much alcohol only one or two times. Some people with this condition drink alcohol to cope with or escape from negative life events. Others drink to relieve pain or symptoms of mental illness. What increases the risk? You are more likely to develop this condition if:  You have a family history of alcohol use disorder.  Your culture encourages drinking to the point of intoxication, or makes alcohol easy to get.  You had a mood or conduct disorder in  childhood.  You have been a victim of abuse.  You are an adolescent and: ? You have poor grades or difficulties in school. ? Your caregivers do not talk to you about saying no to alcohol, or supervise your activities. ? You are impulsive or you have trouble with self-control. What are the signs or symptoms? Symptoms of this condition include:  Drinkingmore than you want to.  Drinking for longer than you want to.  Trying several times to drink less or to control your drinking.  Spending a lot of time getting alcohol, drinking, or recovering from drinking.  Craving alcohol.  Having problems at work, at school, or at home due to drinking.  Having problems in relationships due to drinking.  Drinking when it is dangerous to drink, such as before driving a car.  Continuing to drink even though you know you might have a physical or mental problem related to drinking.  Needing more and more alcohol to get the same effect  you want from the alcohol (building up tolerance).  Having symptoms of withdrawal when you stop drinking. Symptoms of withdrawal include: ? Fatigue. ? Nightmares. ? Trouble sleeping. ? Depression. ? Anxiety. ? Fever. ? Seizures. ? Severe confusion. ? Feeling or seeing things that are not there (hallucinations). ? Tremors. ? Rapid heart rate. ? Rapid breathing. ? High blood pressure.  Drinking to avoid symptoms of withdrawal. How is this diagnosed? This condition is diagnosed with an assessment. Your health care provider may start the assessment by asking three or four questions about your drinking. Your health care provider may perform a physical exam or do lab tests to see if you have physical problems resulting from alcohol use. She or he may refer you to a mental health professional for evaluation. How is this treated? Some people with alcohol use disorder are able to reduce their alcohol use to low-risk levels. Others need to completely quit drinking alcohol. When necessary, mental health professionals with specialized training in substance use treatment can help. Your health care provider can help you decide how severe your alcohol use disorder is and what type of treatment you need. The following forms of treatment are available:  Detoxification. Detoxification involves quitting drinking and using prescription medicines within the first week to help lessen withdrawal symptoms. This treatment is important for people who have had withdrawal symptoms before and for heavy drinkers who are likely to have withdrawal symptoms. Alcohol withdrawal can be dangerous, and in severe cases, it can cause death. Detoxification may be provided in a home, community, or primary care setting, or in a hospital or substance use treatment facility.  Counseling. This treatment is also called talk therapy. It is provided by substance use treatment counselors. A counselor can address the reasons you use  alcohol and suggest ways to keep you from drinking again or to prevent problem drinking. The goals of talk therapy are to: ? Find healthy activities and ways for you to cope with stress. ? Identify and avoid the things that trigger your alcohol use. ? Help you learn how to handle cravings.  Medicines.Medicines can help treat alcohol use disorder by: ? Decreasing alcohol cravings. ? Decreasing the positive feeling you have when you drink alcohol. ? Causing an uncomfortable physical reaction when you drink alcohol (aversion therapy).  Support groups. Support groups are led by people who have quit drinking. They provide emotional support, advice, and guidance. These forms of treatment are often combined. Some people with this condition benefit from a  combination of treatments provided by specialized substance use treatment centers. Follow these instructions at home:  Take over-the-counter and prescription medicines only as told by your health care provider.  Check with your health care provider before starting any new medicines.  Ask friends and family members not to offer you alcohol.  Avoid situations where alcohol is served, including gatherings where others are drinking alcohol.  Create a plan for what to do when you are tempted to use alcohol.  Find hobbies or activities that you enjoy that do not include alcohol.  Keep all follow-up visits as told by your health care provider. This is important. How is this prevented?  If you drink, limit alcohol intake to no more than 1 drink a day for nonpregnant women and 2 drinks a day for men. One drink equals 12 oz of beer, 5 oz of wine, or 1 oz of hard liquor.  If you have a mental health condition, get treatment and support.  Do not give alcohol to adolescents.  If you are an adolescent: ? Do not drink alcohol. ? Do not be afraid to say no if someone offers you alcohol. Speak up about why you do not want to drink. You can be a  positive role model for your friends and set a good example for those around you by not drinking alcohol. ? If your friends drink, spend time with others who do not drink alcohol. Make new friends who do not use alcohol. ? Find healthy ways to manage stress and emotions, such as meditation or deep breathing, exercise, spending time in nature, listening to music, or talking with a trusted friend or family member. Contact a health care provider if:  You are not able to take your medicines as told.  Your symptoms get worse.  You return to drinking alcohol (relapse) and your symptoms get worse. Get help right away if:  You have thoughts about hurting yourself or others. If you ever feel like you may hurt yourself or others, or have thoughts about taking your own life, get help right away. You can go to your nearest emergency department or call:  Your local emergency services (911 in the [place]  A suicide crisis helpline, such as the National Suicide Prevention Lifeline at 684 170 0990. This is open 24 hours a day. Summary  Alcohol use disorder is when your drinking disrupts your daily life. When you have this condition, you drink too much alcohol and you cannot control your drinking.  Treatment may include detoxification, counseling, medicine, and support groups.  Ask friends and family members not to offer you alcohol. Avoid situations where alcohol is served.  Get help right away if you have thoughts about hurting yourself or others. This information is not intended to replace advice given to you by your health care provider. Make sure you discuss any questions you have with your health care provider. Document Revised: 05/18/2017 Document Reviewed: 03/02/2016 Elsevier Patient Education  2020 [employer].    Information on my medicine - XARELTO (Rivaroxaban)  Why was Xarelto prescribed for you? Xarelto was prescribed for you to reduce the risk of a blood clot forming that can  cause a stroke if you have a medical condition called atrial fibrillation (a type of irregular heartbeat).  What do you need to know about xarelto ? Take your Xarelto ONCE DAILY at the same time every day with your evening meal. If you have difficulty swallowing the tablet whole, you may crush it and mix in  applesauce just prior to taking your dose.  Take Xarelto exactly as prescribed by your doctor and DO NOT stop taking Xarelto without talking to the doctor who prescribed the medication.  Stopping without other stroke prevention medication to take the place of Xarelto may increase your risk of developing a clot that causes a stroke.  Refill your prescription before you run out.  After discharge, you should have regular check-up appointments with your healthcare provider that is prescribing your Xarelto.  In the future your dose may need to be changed if your kidney function or weight changes by a significant amount.  What do you do if you miss a dose? If you are taking Xarelto ONCE DAILY and you miss a dose, take it as soon as you remember on the same day then continue your regularly scheduled once daily regimen the next day. Do not take two doses of Xarelto at the same time or on the same day.   Important Safety Information A possible side effect of Xarelto is bleeding. You should call your healthcare provider right away if you experience any of the following: ? Bleeding from an injury or your nose that does not stop. ? Unusual colored urine (red or dark brown) or unusual colored stools (red or black). ? Unusual bruising for unknown reasons. ? A serious fall or if you hit your head (even if there is no bleeding).  Some medicines may interact with Xarelto and might increase your risk of bleeding while on Xarelto. To help avoid this, consult your healthcare provider or pharmacist prior to using any new prescription or non-prescription medications, including herbals, vitamins,  non-steroidal anti-inflammatory drugs (NSAIDs) and supplements.  This website has more information on Xarelto: VisitDestination.com.br.    Heart-Healthy Eating Plan Heart-healthy meal planning includes:  Eating less unhealthy fats.  Eating more healthy fats.  Making other changes in your diet. Talk with your doctor or a diet specialist (dietitian) to create an eating plan that is right for you. What is my plan? Your doctor may recommend an eating plan that includes:  Total fat: ______% or less of total calories a day.  Saturated fat: ______% or less of total calories a day.  Cholesterol: less than _________mg a day. What are tips for following this plan? Cooking Avoid frying your food. Try to bake, boil, grill, or broil it instead. You can also reduce fat by:  Removing the skin from poultry.  Removing all visible fats from meats.  Steaming vegetables in water or broth. Meal planning   At meals, divide your plate into four equal parts: ? Fill one-half of your plate with vegetables and green salads. ? Fill one-fourth of your plate with whole grains. ? Fill one-fourth of your plate with lean protein foods.  Eat 4-5 servings of vegetables per day. A serving of vegetables is: ? 1 cup of raw or cooked vegetables. ? 2 cups of raw leafy greens.  Eat 4-5 servings of fruit per day. A serving of fruit is: ? 1 medium whole fruit. ?  cup of dried fruit. ?  cup of fresh, frozen, or canned fruit. ?  cup of 100% fruit juice.  Eat more foods that have soluble fiber. These are apples, broccoli, carrots, beans, peas, and barley. Try to get 20-30 g of fiber per day.  Eat 4-5 servings of nuts, legumes, and seeds per week: ? 1 serving of dried beans or legumes equals  cup after being cooked. ? 1 serving of nuts is  cup. ? 1 serving of seeds equals 1 tablespoon. General information  Eat more home-cooked food. Eat less restaurant, buffet, and fast food.  Limit or avoid  alcohol.  Limit foods that are high in starch and sugar.  Avoid fried foods.  Lose weight if you are overweight.  Keep track of how much salt (sodium) you eat. This is important if you have high blood pressure. Ask your doctor to tell you more about this.  Try to add vegetarian meals each week. Fats  Choose healthy fats. These include olive oil and canola oil, flaxseeds, walnuts, almonds, and seeds.  Eat more omega-3 fats. These include salmon, mackerel, sardines, tuna, flaxseed oil, and ground flaxseeds. Try to eat fish at least 2 times each week.  Check food labels. Avoid foods with trans fats or high amounts of saturated fat.  Limit saturated fats. ? These are often found in animal products, such as meats, butter, and cream. ? These are also found in plant foods, such as palm oil, palm kernel oil, and coconut oil.  Avoid foods with partially hydrogenated oils in them. These have trans fats. Examples are stick margarine, some tub margarines, cookies, crackers, and other baked goods. What foods can I eat? Fruits All fresh, canned (in natural juice), or frozen fruits. Vegetables Fresh or frozen vegetables (raw, steamed, roasted, or grilled). Green salads. Grains Most grains. Choose whole wheat and whole grains most of the time. Rice and pasta, including brown rice and pastas made with whole wheat. Meats and other proteins Lean, well-trimmed beef, veal, pork, and lamb. Chicken and [place] without skin. All fish and shellfish. Wild duck, rabbit, pheasant, and venison. Egg whites or low-cholesterol egg substitutes. Dried beans, peas, lentils, and tofu. Seeds and most nuts. Dairy Low-fat or nonfat cheeses, including ricotta and mozzarella. Skim or 1% milk that is liquid, powdered, or evaporated. Buttermilk that is made with low-fat milk. Nonfat or low-fat yogurt. Fats and oils Non-hydrogenated (trans-free) margarines. Vegetable oils, including soybean, sesame, sunflower, olive, peanut,  safflower, corn, canola, and cottonseed. Salad dressings or mayonnaise made with a vegetable oil. Beverages Mineral water. Coffee and tea. Diet carbonated beverages. Sweets and desserts Sherbet, gelatin, and fruit ice. Small amounts of dark chocolate. Limit all sweets and desserts. Seasonings and condiments All seasonings and condiments. The items listed above may not be a complete list of foods and drinks you can eat. Contact a dietitian for more options. What foods should I avoid? Fruits Canned fruit in heavy syrup. Fruit in cream or butter sauce. Fried fruit. Limit coconut. Vegetables Vegetables cooked in cheese, cream, or butter sauce. Fried vegetables. Grains Breads that are made with saturated or trans fats, oils, or whole milk. Croissants. Sweet rolls. Donuts. High-fat crackers, such as cheese crackers. Meats and other proteins Fatty meats, such as hot dogs, ribs, sausage, bacon, rib-eye roast or steak. High-fat deli meats, such as salami and bologna. Caviar. Domestic duck and goose. Organ meats, such as liver. Dairy Cream, sour cream, cream cheese, and creamed cottage cheese. Whole-milk cheeses. Whole or 2% milk that is liquid, evaporated, or condensed. Whole buttermilk. Cream sauce or high-fat cheese sauce. Yogurt that is made from whole milk. Fats and oils Meat fat, or shortening. Cocoa butter, hydrogenated oils, palm oil, coconut oil, palm kernel oil. Solid fats and shortenings, including bacon fat, salt pork, lard, and butter. Nondairy cream substitutes. Salad dressings with cheese or sour cream. Beverages Regular sodas and juice drinks with added sugar. Sweets and desserts Frosting. Pudding. Cookies. Cakes.  Pies. Milk chocolate or white chocolate. Buttered syrups. Full-fat ice cream or ice cream drinks. The items listed above may not be a complete list of foods and drinks to avoid. Contact a dietitian for more information. Summary  Heart-healthy meal planning includes eating  less unhealthy fats, eating more healthy fats, and making other changes in your diet.  Eat a balanced diet. This includes fruits and vegetables, low-fat or nonfat dairy, lean protein, nuts and legumes, whole grains, and heart-healthy oils and fats. This information is not intended to replace advice given to you by your health care provider. Make sure you discuss any questions you have with your health care provider. Document Revised: 08/09/2017 Document Reviewed: 07/13/2017 Elsevier Patient Education  2020 [employer].

## 2019-12-19 NOTE — Progress Notes (Addendum)
Subjective: Patient examined at bedside this morning. States that he feels better now that his heart rate is controlled. Denies any chest pain, but notes having CP 2 weeks ago. Reports continued orthopnea and wheezing, with mild improvement. He states his abd distension and extremity edema decreased with diuretics. Patient states that he is compliant with all his home medications.  He reports previous reaction to folic acid administration, states his hand swelled up and became red.  Also had discussion about his alcohol use. He reports a family history of alcohol use and heart disease, and feels they are related. He states that he started drinking at 49 y/o. He has 6-8 malt alcohol drinks a day (10% alcohol). He uses these in the evening to help him sleep, and also frequently wakes at night and has 1-2 drinks to return to sleep. He has been sleeping well here. He notes a hx of ADHD and states he was using alcohol to self-medicate. He states that he has been wanting to quit for a while now with the help of his wife and is receptive to counseling on that.   Objective:  Vital signs in last 24 hours: Vitals:   12/19/19 0600 12/19/19 0604 12/19/19 0748 12/19/19 0841  BP:  122/78 (!) 126/99   Pulse:   88   Resp:   16   Temp:   98.6 F (37 C)   TempSrc:   Oral   SpO2: 94% 96% 97% 97%  Weight:      Height:       Physical Exam Constitutional:      Appearance: Normal appearance.  HENT:     Head: Normocephalic.  Eyes:     General: No scleral icterus.    Extraocular Movements: Extraocular movements intact.  Cardiovascular:     Rate and Rhythm: Normal rate. Rhythm irregular.     Heart sounds: Normal heart sounds.  Pulmonary:     Breath sounds: Wheezing present.  Musculoskeletal:        General: Normal range of motion.     Cervical back: Normal range of motion.     Right lower leg: Edema present.     Left lower leg: Edema present.     Comments: +1 edema bilat LE Mild JVD  Skin:     General: Skin is warm.  Neurological:     Mental Status: He is alert.  Psychiatric:        Mood and Affect: Mood normal.     Assessment/Plan:  Principal Problem:   Atrial fibrillation with RVR [facility])  49yo male with PMH of atrial fibrillation on Xarelto, HTN, and alcohol abuse and tobacco abuse admitted to the hospital with 2 weeks of worsening dyspnea on exertion, palpitation and dizziness   Atrial fibrillation  - Likely secondary to alcohol abuse. Patient was seeing the cardiologist Dr. Tomie China last August and failed to follow up for his Echo and Lexiscan. Patient admits to regularly taking his Xarelto and [place].  - Tele shows irregular rhythm with controlled rate.  - Switched IV diltiazem to PO Diltiazem BID 90 mg BID - Cardiology Dr. Johney Frame is on board with the treatment plan and will help transition patient to the A-fib clinic when discharge to better manage his condition.  - Continue Xarelto  - Continue cardiac monitor  Peripheral edema  - Echo shows EF 50% with moderate LVH. IVC is normal size. - Patient's peripheral edema and abdominal distention improved with 40 mg Lasix IV.  - Add another  dose of Lasix 40 mg PO to help with his orthopnea.   Alcohol abuse - CIWA 2 overnight  - Continue Thiamine and B12  - Will not start folic acid due to allergic reaction of extremity swelling - Continue CIWA with Ativan - Patient is willing to quit alcohol drinking. Will provide resources for alcohol cessation  - Replete potassium and magnesium   COPD - Continue Duonebs  Hypertension  - Diltiazem 90 mg BID  Pre-DM  - Sensitive sliding scale   Diet: carb modified VTE: xarelto IVF: none Code: full  Prior to Admission Living Arrangement: Home Anticipated Discharge Location: Home Barriers to Discharge: Rate control, cardiac evaluation Dispo: Anticipated discharge in approximately 1 day(s).   Doran Stabler, DO 12/19/2019, 1:12 PM Pager: [phone] After 5pm on weekdays  and 1pm on weekends: On Call pager [phone]

## 2019-12-19 NOTE — Care Management (Addendum)
12-19-19 1121  Patient presented for Atrial Fib- without insurance at this time. Patient states he is not working now. Patient has been seen at the [facility] and [facility] and a follow up appointment has been established and placed on the AVS. Case Manager spoke with the physician and medications will be sent to [facility] to see if they can fill today and patient will then transition home on Saturday. TOC has changed the pharmacy in Epic.Case Manager will see if patient is eligible for the [facility] program. Case Manager did provide the patient with substance abuse resources in the community. Patient states he lives in a mobile home and he will have transportation to appointments. No further needs from Case Manager at this time. Gala Lewandowsky, RN,  BSN Case Manager    12-19-19 1232 Case Manager spoke with physician again and Cardiology may need to tweak medications. Patient may need to go to a local pharmacy if he needs any new medications once stable. The weekend [facility] team will assist with medications if the patient needs additional assistance.

## 2019-12-19 NOTE — Progress Notes (Signed)
Patient's HR is sustaining 140s-150s while patient is sitting up on the side of the bed eating lunch. Patient denies symptoms other than anxiety. Notified Dr. Cyndie Chime of patient status via phone. Full set of vital signs obtained; will reassess vital signs after patient finishes eating.

## 2019-12-19 NOTE — Plan of Care (Signed)
Discussed with patient plan of care for the evening, pain management and safety issues with some teach back displayed

## 2019-12-20 DIAGNOSIS — I10 Essential (primary) hypertension: Secondary | ICD-10-CM

## 2019-12-20 DIAGNOSIS — I4891 Unspecified atrial fibrillation: Secondary | ICD-10-CM

## 2019-12-20 DIAGNOSIS — F1010 Alcohol abuse, uncomplicated: Secondary | ICD-10-CM

## 2019-12-20 LAB — CBC
HCT: 43.5 % (ref 39.0–52.0)
Hemoglobin: 14.7 g/dL (ref 13.0–17.0)
MCH: 34.4 pg — ABNORMAL HIGH (ref 26.0–34.0)
MCHC: 33.8 g/dL (ref 30.0–36.0)
MCV: 101.9 fL — ABNORMAL HIGH (ref 80.0–100.0)
Platelets: 184 10*3/uL (ref 150–400)
RBC: 4.27 MIL/uL (ref 4.22–5.81)
RDW: 12.6 % (ref 11.5–15.5)
WBC: 9.6 10*3/uL (ref 4.0–10.5)
nRBC: 0 % (ref 0.0–0.2)

## 2019-12-20 LAB — COMPREHENSIVE METABOLIC PANEL
ALT: 31 U/L (ref 0–44)
AST: 64 U/L — ABNORMAL HIGH (ref 15–41)
Albumin: 2.9 g/dL — ABNORMAL LOW (ref 3.5–5.0)
Alkaline Phosphatase: 72 U/L (ref 38–126)
Anion gap: 11 (ref 5–15)
BUN: 11 mg/dL (ref 6–20)
CO2: 25 mmol/L (ref 22–32)
Calcium: 8.8 mg/dL — ABNORMAL LOW (ref 8.9–10.3)
Chloride: 104 mmol/L (ref 98–111)
Creatinine, Ser: 0.92 mg/dL (ref 0.61–1.24)
GFR calc Af Amer: >60 mL/min (ref >60)
GFR calc non Af Amer: >60 mL/min (ref >60)
Glucose, Bld: 117 mg/dL — ABNORMAL HIGH (ref 70–99)
Potassium: 3.4 mmol/L — ABNORMAL LOW (ref 3.5–5.1)
Sodium: 140 mmol/L (ref 135–145)
Total Bilirubin: 1.1 mg/dL (ref 0.3–1.2)
Total Protein: 6.7 g/dL (ref 6.5–8.1)

## 2019-12-20 LAB — GLUCOSE, CAPILLARY: Glucose-Capillary: 166 mg/dL — ABNORMAL HIGH (ref 70–99)

## 2019-12-20 LAB — MAGNESIUM: Magnesium: 1.9 mg/dL (ref 1.7–2.4)

## 2019-12-20 MED ORDER — DILTIAZEM HCL 60 MG PO TABS: 90.0000 mg | ORAL_TABLET | Freq: Three times a day (TID) | ORAL | Status: DC

## 2019-12-20 MED ORDER — DILTIAZEM HCL ER 120 MG PO CP24
240.0000 mg | ORAL_CAPSULE | Freq: Two times a day (BID) | ORAL | 0 refills | Status: DC
Start: 2019-12-20 — End: 2020-01-07

## 2019-12-20 MED ORDER — DILTIAZEM HCL ER 90 MG PO CP12: 90.0000 mg | ORAL_CAPSULE | Freq: Three times a day (TID) | ORAL | Status: DC

## 2019-12-20 NOTE — Discharge Summary (Signed)
Name: Kevin Duncan MRN: [id number] DOB: 1970-09-25 49 y.o. PCP: Antony Blackbird, MD  Date of Admission: 12/18/2019  9:25 AM Date of Discharge: 12/20/2019 Attending Physician: Dr. Rebeca Alert   Discharge Diagnosis: 1.  Atrial fibrillation with RVR 2.  Alcohol use  Discharge Medications: Allergies as of 12/20/2019      Reactions   Folic Acid Swelling   redness   Hydrocodone Hives      Medication List    STOP taking these medications   diltiazem 120 MG 24 hr capsule Commonly known as: Cartia XT   doxycycline 100 MG tablet Commonly known as: VIBRA-TABS   FOLIC ACID PO   predniSONE 20 MG tablet Commonly known as: DELTASONE     TAKE these medications   albuterol 108 (90 Base) MCG/ACT inhaler Commonly known as: VENTOLIN HFA Inhale 2 puffs into the lungs every 6 (six) hours as needed for wheezing or shortness of breath.   diltiazem 120 MG 24 hr capsule Commonly known as: DILACOR XR Take 2 capsules (240 mg total) by mouth 2 (two) times daily.   furosemide 20 MG tablet Commonly known as: LASIX One daily by mouth x 3 days then as needed for leg swelling What changed:   how much to take  how to take this  when to take this  additional instructions   gabapentin 100 MG capsule Commonly known as: NEURONTIN One twice per day and 2 at bedtime for foot numbness What changed:   how much to take  how to take this  when to take this  additional instructions   glucose blood test strip Use to check fasting blood sugar daily.   GOODY HEADACHE PO Take 2 Packages by mouth daily.   metFORMIN 500 MG 24 hr tablet Commonly known as: GLUCOPHAGE-XR Take 1 tablet (500 mg total) by mouth daily after supper.   rivaroxaban 20 MG Tabs tablet Commonly known as: Xarelto Take 1 tablet (20 mg total) by mouth daily with supper.   True Metrix Meter w/Device Kit 1 kit by Does not apply route daily. Use to check fasting blood sugar daily.   TRUEplus Lancets 28G Misc Use to check  fasting blood sugar daily.   vitamin B-12 1000 MCG tablet Commonly known as: CYANOCOBALAMIN Take 1,000 mcg by mouth daily.       Disposition and follow-up:   [name] was discharged from [facility] in Stable condition.  At the hospital follow up visit please address:  1. A.  Atrial fibrillation with RVR: Discharged on diltiazem 240 mg twice daily and asked to follow-up with the A. fib clinic  B.  Alcohol use: Counseled on alcohol use and correlation with atrial fibrillation.  Advised to follow-up with PCP to discuss starting naltrexone  2.  Labs / imaging needed at time of follow-up: None  3.  Pending labs/ test needing follow-up: None  Follow-up Appointments:  Follow-up Information    Argentina Donovan, PA-C Follow up on 01/07/2020.   Specialty: Family Medicine Why: @ 9:50 for hospital follow up appointment- if you cannot make this scheduled appointment please call the office to reschedule.  Contact information: [place] [address] [place] [facility] Course by problem list: 1.  Atrial fibrillation with RVR: Mr. Mesick is a 49 year old gentleman with medical history significant for atrial fibrillation on Xarelto, hypertension, alcohol and tobacco use who presented to the hospital with  a 2-week history of dyspnea on exertion, palpitation and dizziness.  He was found to be in A. fib with RVR and was initially started on IV diltiazem which was subsequently transitioned to oral.  He was evaluated by our electrophysiology team who recommended to start p.o. diltiazem 240 mg twice daily and asked to follow-up with the atrial fibrillation clinic.  An echocardiogram was performed which showed LVEF 50% without any structural heart abnormalities with exception of left ventricular hypertrophy.  2. Alcohol use: He states that he began drinking since age 16 and drinks about 68 mg a day contain about 10% alcohol.  He reports  that he drinks only at night to help him sleep.  He expressed desire to quit and received much counseling from [place].  He was asked to follow-up with this PCP to discuss initiating naltrexone.  Discharge Vitals:   BP (!) 153/103    Pulse (!) 113    Temp 98.6 F (37 C) (Oral)    Resp 20    Ht 6' (1.829 m)    Wt 102.5 kg    SpO2 98%    BMI 30.65 kg/m   Pertinent Labs, Studies, and Procedures:  ECHOCARDIOGRAM REPORT       Patient Name:  Kevin Duncan Date of Exam: 12/18/2019  Medical Rec #: [id number]    Height:    72.0 in  Accession #:  [id number]   Weight:    225.0 lb  Date of Birth: [birth year]    BSA:     2.240 m  Patient Age:  63 years    BP:      143/95 mmHg  Patient Gender: M        HR:      94 bpm.  Exam Location: Inpatient   Procedure: 2D Echo, Cardiac Doppler and Color Doppler   Indications:  Atrial fibrillation    History:    Patient has no prior history of Echocardiogram  examinations.         Arrythmias:Atrial Fibrillation; Risk Factors:Hypertension  and         Current Smoker.    Sonographer:  Clayton Lefort RDCS (AE)  Referring Phys: [phone] [facility]     Sonographer Comments: Suboptimal subcostal window. Image acquisition  challenging due to respiratory motion.  IMPRESSIONS    1. Left ventricular ejection fraction, by estimation, is 50%. The left  ventricle has low normal function. The left ventricle has no regional wall  motion abnormalities. There is moderate left ventricular hypertrophy. Left  ventricular diastolic parameters  are indeterminate.  2. Right ventricular systolic function is normal. The right ventricular  size is normal. Tricuspid regurgitation signal is inadequate for assessing  PA pressure.  3. Left atrial size was mildly dilated.  4. Right atrial size was mildly dilated.  5. The mitral valve is normal in structure. Trivial mitral valve  regurgitation. No  evidence of mitral stenosis.  6. The aortic valve is normal in structure. Aortic valve regurgitation is  not visualized. No aortic stenosis is present.  7. Aortic dilatation noted. There is borderline dilatation of the aortic  root measuring 39 mm.  8. The inferior vena cava is normal in size with greater than 50%  respiratory variability, suggesting right atrial pressure of 3 mmHg.   FINDINGS  Left Ventricle: Left ventricular ejection fraction, by estimation, is  50%. The left ventricle has low normal function. The left ventricle has no  regional wall motion abnormalities. The left ventricular internal cavity  size  was normal in size. There is  moderate left ventricular hypertrophy. Left ventricular diastolic  parameters are indeterminate.   Right Ventricle: The right ventricular size is normal. No increase in  right ventricular wall thickness. Right ventricular systolic function is  normal. Tricuspid regurgitation signal is inadequate for assessing PA  pressure.   Left Atrium: Left atrial size was mildly dilated.   Right Atrium: Right atrial size was mildly dilated.   Pericardium: There is no evidence of pericardial effusion.   Mitral Valve: The mitral valve is normal in structure. Normal mobility of  the mitral valve leaflets. Trivial mitral valve regurgitation. No evidence  of mitral valve stenosis.   Tricuspid Valve: The tricuspid valve is normal in structure. Tricuspid  valve regurgitation is not demonstrated. No evidence of tricuspid  stenosis.   Aortic Valve: The aortic valve is normal in structure. Aortic valve  regurgitation is not visualized. No aortic stenosis is present. Aortic  valve mean gradient measures 2.0 mmHg. Aortic valve peak gradient measures  3.7 mmHg. Aortic valve area, by VTI  measures 4.33 cm.   Pulmonic Valve: The pulmonic valve was not well visualized. Pulmonic valve  regurgitation is not visualized. No evidence of pulmonic stenosis.    Aorta: Aortic dilatation noted. There is borderline dilatation of the  aortic root measuring 39 mm.   Venous: The inferior vena cava is normal in size with greater than 50%  respiratory variability, suggesting right atrial pressure of 3 mmHg.   IAS/Shunts: No atrial level shunt detected by color flow Doppler.   Discharge Instructions: Discharge Instructions    Diet - low sodium heart healthy   Complete by: As directed    Discharge instructions   Complete by: As directed    Mr. Fairmount,  It was a pleasure taking care of you here in the hospital.  You were admitted because of atrial fibrillation.  We started you on an IV medication and transition you to an oral medication.  Here my recommendations after hospital visit:  1.  Please start taking diltiazem 240 mg twice daily.  Our case manager will speak to you before you leave the hospital regarding what pharmacy to pick this medicine.  2.  I would like for you to follow-up with the cardiologist next week.  You should receive a call to set up this appointment  3.  As we discussed, we would like for you to abstain from alcohol use.  Please talk to your primary doctor regarding medication to help with your anxiety so you to be able to sleep without drinking alcohol.  Take care!   Increase activity slowly   Complete by: As directed       Signed: Jean Rosenthal, MD 12/20/2019, 1:28 PM   Pager: [phone] Internal Medicine Teaching Service

## 2019-12-20 NOTE — Consult Note (Signed)
ELECTROPHYSIOLOGY CONSULT NOTE    Primary Care Physician: Cain Saupe, MD Referring Physician:  Dr Sandre Kitty  Admit Date: 12/18/2019  Reason for consultation:  afib  Kevin Duncan is a 49 y.o. male with a h/o heavy ETOH, persistent afib and HTN who was admitted with worsening SOB and dizziness. The patient reports initially being diagnosed with atrial fibrillation 10/2018.  He has been evaluated by Dr Tomie China previously and follows primarily in the [facility]. He has been treated with xarelto for stroke prevention and calcium channel blockers for rate control.  Treatment has been limited by heavy ETOH use and no insurance. He states "I have been drinking since I was 49 years old".  He admits to drinking more than a 6 pack per day.  He also smokes.  Today, he denies symptoms of palpitations, chest pain,  lower extremity edema, dizziness, presyncope, syncope, or neurologic sequela. The patient is tolerating medications without difficulties and is otherwise without complaint today.   Past Medical History:  Diagnosis Date  . Atrial fibrillation (HCC)   . Hypertension    Past Surgical History:  Procedure Laterality Date  . THUMB FUSION Bilateral     . diltiazem  90 mg Oral Q12H  . gabapentin  300 mg Oral Daily  . insulin aspart  0-9 Units Subcutaneous TID WC  . ipratropium  0.5 mg Nebulization Once  . potassium chloride  40 mEq Oral BID  . rivaroxaban  20 mg Oral Q supper  . thiamine  100 mg Oral Daily   Or  . thiamine  100 mg Intravenous Daily  . vitamin B-12  1,000 mcg Oral Daily     Allergies  Allergen Reactions  . Folic Acid Swelling    redness  . Hydrocodone Hives    Social History   Socioeconomic History  . Marital status: Single    Spouse name: Not on file  . Number of children: Not on file  . Years of education: Not on file  . Highest education level: Not on file  Occupational History  . Not on file  Tobacco Use  . Smoking status:  Current Every Day Smoker    Packs/day: 1.50    Types: Cigarettes  . Smokeless tobacco: Never Used  Substance and Sexual Activity  . Alcohol use: Yes    Comment: 5-6 beer daily  . Drug use: No  . Sexual activity: Not on file  Other Topics Concern  . Not on file  Social History Narrative  . Not on file   Social Determinants of Health   Financial Resource Strain:   . Difficulty of Paying Living Expenses:   Food Insecurity:   . Worried About Programme researcher, broadcasting/film/video in the Last Year:   . Barista in the Last Year:   Transportation Needs:   . Freight forwarder (Medical):   Marland Kitchen Lack of Transportation (Non-Medical):   Physical Activity:   . Days of Exercise per Week:   . Minutes of Exercise per Session:   Stress:   . Feeling of Stress :   Social Connections:   . Frequency of Communication with Friends and Family:   . Frequency of Social Gatherings with Friends and Family:   . Attends Religious Services:   . Active Member of Clubs or Organizations:   . Attends Banker Meetings:   Marland Kitchen Marital Status:   Intimate Partner Violence:   . Fear of Current or Ex-Partner:   . Emotionally  Abused:   Marland Kitchen Physically Abused:   . Sexually Abused:     Family History  Problem Relation Age of Onset  . Diabetes Mother   . Heart disease Father   . Heart disease Brother   . Diabetes Maternal Grandmother   . Heart disease Paternal Grandmother     ROS- All systems are reviewed and negative except as per the HPI above  Physical Exam: Telemetry:  Afib, V rates 100-120s Vitals:   12/19/19 1350 12/19/19 2108 12/20/19 0739 12/20/19 0819  BP:  (!) 153/100 (!) 133/97 (!) 153/103  Pulse: (!) 125  (!) 113   Resp:   20   Temp:   98.6 F (37 C)   TempSrc:   Oral   SpO2:   98%   Weight:      Height:        GEN- The patient is disheveled appearing, alert and oriented x 3 today.   Head- normocephalic, atraumatic Eyes-  Sclera clear, conjunctiva pink Ears- hearing  intact Oropharynx- clear Neck- supple,   Lungs-  normal work of breathing Heart-tachycardic irregular rhythm GI- soft  Extremities- no clubbing, cyanosis, or edema MS- no significant deformity or atrophy Skin- no rash or lesion Psych- euthymic mood, full affect Neuro- strength and sensation are intact  EKG-  afib  Labs:   Lab Results  Component Value Date   WBC 9.6 12/20/2019   HGB 14.7 12/20/2019   HCT 43.5 12/20/2019   MCV 101.9 (H) 12/20/2019   PLT 184 12/20/2019    Recent Labs  Lab 12/20/19 0440  NA 140  K 3.4*  CL 104  CO2 25  BUN 11  CREATININE 0.92  CALCIUM 8.8*  PROT 6.7  BILITOT 1.1  ALKPHOS 72  ALT 31  AST 64*  GLUCOSE 117*     Echo:  EF 50%, LA 38 msec  ASSESSMENT AND PLAN:   1. Persistent atrial fibrillation The patient has symptomatic persistent afib. Therapy has been limited by heavy ETOH and insurance.  He has seen Dr Normajean Baxter previously. Given low K and ETOH, I would worry about starting AAD therapy at this time. We will continue rate control and anticoagulation and then consider AADs in the future if he is compliant with AF clinic follow-up, ETOH avoidance,and anticoagulation. I think that AAD options would be flecainide or amiodarone.  I would worry about tikosyn with low K and ETOH. If he can obtain insurance, we could eventually consider ablation, though anticipated success rates would be low without lifestyle change. chads2vasc score is 1.  He is on xarelto.  I would advise that we change diltiazem to diltiazem CD 240mg  BID and then discharge today.  If he cannot afford diltiazem CD, then I would advise that we continue diltiazem 90mg  TID and add digoxin.  I do feel that it is reasonable to discharge with close outpatient follow-up in the AF clinic. We will offer follow-up to him for next week.  2. ETOH advoidance advised  3. Hypertensive cardiovascular disease Stable No change required today  Electrophysiology team to see as  needed while here. Please call with questions.  , MD 12/20/2019  9:24 AM

## 2019-12-20 NOTE — TOC Transition Note (Addendum)
Transition of Care [facility]) - CM/SW Discharge Note   Patient Details  Name: Kevin Duncan MRN: [id number] Date of Birth: February 06, 1971  Transition of Care [facility]) CM/SW Contact:  Bess Kinds, RN Phone Number: [phone] 12/20/2019, 12:16 PM   Clinical Narrative:     Notified by MD of medication assistance needs. Match letter provided and explained to patient and fiance. Verbalized understanding and expressed appreciation. Encouraged f/u with PCP at [facility]. Advised to contact [facility] for substance abuse follow up. No further TOC needs identified.   Final next level of care: Home/Self Care Barriers to Discharge: No Barriers Identified   Patient Goals and CMS Choice        Discharge Placement                       Discharge Plan and Services                DME Arranged: N/A DME Agency: NA       HH Arranged: NA HH Agency: NA        Social Determinants of Health (SDOH) Interventions     Readmission Risk Interventions No flowsheet data found.

## 2019-12-20 NOTE — Progress Notes (Signed)
   Subjective: HD#1   Overnight: No acute overnight events.  Today, Kevin Duncan was examined at bedside with his girlfriend present.  He states he is doing well and denies palpitation, chest pain, shortness of breath.  He is able to tolerate all his diet and was just seen by Dr. Johney Frame prior to my visit.  Objective:  Vital signs in last 24 hours: Vitals:   12/19/19 1212 12/19/19 1243 12/19/19 1350 12/19/19 2108  BP: (!) 155/110 (!) 152/117  (!) 153/100  Pulse: (!) 152 (!) 125 (!) 125   Resp: 20 20    Temp: 98.4 F (36.9 C) 98.5 F (36.9 C)    TempSrc: Oral Oral    SpO2: 97% 98%    Weight:      Height:       Const: In no apparent distress, lying comfortably in bed, conversational Resp: CTA BL, no wheezes, crackles, rhonchi CV: Irregularly irregular, no murmurs, gallop, rub   Assessment/Plan:  Principal Problem:   Atrial fibrillation with RVR (HCC) Active Problems:   Essential hypertension   Cigarette smoker   Alcohol abuse   Hypervolemia   Wheezing  Kevin Duncan is a 49yo male with PMH of atrial fibrillation on Xarelto, HTN, and alcohol abuse and tobacco abuse admitted to the hospital with 2 weeks of worsening dyspnea on exertion, palpitation and dizziness here for management of A. fib with RVR   Atrial fibrillation with RVR - Likely secondary to alcohol abuse. Patient was seeing the cardiologist Dr. Tomie China last August and failed to follow up for his Echo and Lexiscan. Patient admits to regularly taking his Xarelto and [place].  - Continues to remain in A. fib with HR overnight in the 90s-110s - Continue p.o. diltiazem 90 mg 3 times daily - Continue Xarelto - Appreciate cardiology recommendations - Continue cardiac monitoring  Final Cardiology reccs:  - Change diltiazem to diltiazem CD 240mg  BID and then discharge today.  If he cannot afford diltiazem CD, then I would advise that we continue diltiazem 90mg  TID and add digoxin. - Follow up with A-Fibb clinic next  week   Peripheral edema likely due to venous insufficiency- resolved - Echo shows EF 50% with moderate LVH. IVC is normal size.   Alcohol abuse - CIWA overnight ranging from 5-10 the difficult to assess due to tachycardia from atrial fibrillation.  Received 5 mg of Ativan - Continue Thiamine and B12  - Continue CIWA with Ativan - Discharge on naltrexone and community resources.  Appreciate social work recommendation   COPD - Continue Duonebs   Hypertension  - Diltiazem 90 mg BID   Pre-DM  - Sensitive sliding scale    Diet:carb modified  Code:full   FEN: Carb modified diet VTE ppx: Xarelto CODE STATUS: Full code  Prior to Admission Living Arrangement: Home Anticipated Discharge Location: Home Barriers to Discharge: Ongoing medical therapy Dispo: Anticipated discharge in approximately 1-2 day(s).    BZJ:IRCVELF, MD 12/20/2019, 6:36 AM Pager: [phone] Internal Medicine Teaching Service After 5pm on weekdays and 1pm on weekends: On Call pager: [phone]

## 2019-12-23 ENCOUNTER — Telehealth (HOSPITAL_COMMUNITY): Payer: Self-pay | Admitting: Physician Assistant

## 2019-12-23 NOTE — Telephone Encounter (Signed)
Per staff message from Dr. Johney Frame, pt needs hosp f/u appt.  Called and left message for patient to call back to schedule his appt.

## 2019-12-24 ENCOUNTER — Telehealth: Payer: Self-pay

## 2019-12-24 NOTE — Telephone Encounter (Signed)
Transition Care Management Follow-up Telephone Call Date of discharge and from where: 12/20/2019, [facility]  Call placed to patient # [phone], message left with call back requested to this CM.   Call placed to # [phone] phone just rings, unable to leave a message.  He has a hospital follow up appt at [facility] on 01/07/2020

## 2019-12-25 ENCOUNTER — Telehealth: Payer: Self-pay | Admitting: Family Medicine

## 2019-12-25 NOTE — Telephone Encounter (Signed)
Please f/u  Copied from CRM [id number]. Topic: General - Call Back - No Documentation >> Dec 24, 2019 11:15 AM Randol Kern wrote: Reason for CRM: Pt is requesting a call back from the office regarding his card application Best contact: [phone]

## 2019-12-26 NOTE — Telephone Encounter (Signed)
I call the Pt, her his request, He inform me that so far still did not received the 2020 Non filling letter, I provide him with the phone number that he can try to call them to see if they can rush his letter and to call us back when he get the letter to schedule a financial appt since is one of the document we need him to get approve for CAFA and the OC program

## 2019-12-29 NOTE — Progress Notes (Signed)
Primary Care Physician: Antony Blackbird, MD Primary Cardiologist: Dr Geraldo Pitter Primary Electrophysiologist: Dr Rayann Heman Referring Physician: Dr Jonne Ply is a 49 y.o. male with a history of HTN and paroxysmal atrial fibrillation who presents for follow up in the [facility]. The patient reports initially being diagnosed with atrial fibrillation 10/2018.  He has been evaluated by Dr Geraldo Pitter previously and follows primarily in the [facility]. He has been treated with Xarelto for stroke prevention with a CHADS2VASC score of 1 and calcium channel blockers for rate control.  Treatment has been limited by heavy ETOH use and no insurance. Patient was seen at the ED on 12/18/19 for chest pain and SOB and was found to be in afib with RVR. He was seen by Dr Rayann Heman who recommended rate control. Patient reports he has only been taking diltiazem 120 mg once daily instead of the 240 mg BID he was prescribed in the ED. He denies any missed doses of anticoagulation.   Today, he denies symptoms of palpitations, chest pain, orthopnea, PND, lower extremity edema, presyncope, syncope, snoring, daytime somnolence, bleeding, or neurologic sequela. The patient is tolerating medications without difficulties and is otherwise without complaint today.    Atrial Fibrillation Risk Factors:  he does not have symptoms or diagnosis of sleep apnea. he does not have a history of rheumatic fever. he does have a history of alcohol use. The patient does not have a history of early familial atrial fibrillation or other arrhythmias.  he has a BMI of Body mass index is 31.27 kg/m.Marland Kitchen Filed Weights   12/30/19 0836  Weight: 104.6 kg    Family History  Problem Relation Age of Onset  . Diabetes Mother   . Heart disease Father   . Heart disease Brother   . Diabetes Maternal Grandmother   . Heart disease Paternal Grandmother      Atrial Fibrillation Management  history:  Previous antiarrhythmic drugs: none Previous cardioversions: none Previous ablations: none CHADS2VASC score: 1 Anticoagulation history: Xarelto   Past Medical History:  Diagnosis Date  . Atrial fibrillation ([place])   . Hypertension    Past Surgical History:  Procedure Laterality Date  . THUMB FUSION Bilateral     Current Outpatient Medications  Medication Sig Dispense Refill  . albuterol (VENTOLIN HFA) 108 (90 Base) MCG/ACT inhaler Inhale 2 puffs into the lungs every 6 (six) hours as needed for wheezing or shortness of breath.    . Aspirin-Acetaminophen-Caffeine (GOODY HEADACHE PO) Take 2 Packages by mouth daily.     . Blood Glucose Monitoring Suppl (TRUE METRIX METER) w/Device KIT 1 kit by Does not apply route daily. Use to check fasting blood sugar daily. 1 kit 0  . diltiazem (DILACOR XR) 120 MG 24 hr capsule Take 2 capsules (240 mg total) by mouth 2 (two) times daily. 120 capsule 0  . furosemide (LASIX) 20 MG tablet One daily by mouth x 3 days then as needed for leg swelling 30 tablet 3  . gabapentin (NEURONTIN) 100 MG capsule One twice per day and 2 at bedtime for foot numbness 120 capsule 3  . glucose blood test strip Use to check fasting blood sugar daily. 100 each 2  . metFORMIN (GLUCOPHAGE-XR) 500 MG 24 hr tablet Take 1 tablet (500 mg total) by mouth daily after supper. 30 tablet 2  . Potassium 99 MG TABS Take by mouth.    . rivaroxaban (XARELTO) 20 MG TABS tablet Take 1  tablet (20 mg total) by mouth daily with supper. 30 tablet 11  . TRUEplus Lancets 28G MISC Use to check fasting blood sugar daily. 100 each 2  . vitamin B-12 (CYANOCOBALAMIN) 1000 MCG tablet Take 1,000 mcg by mouth daily.      No current facility-administered medications for this encounter.    Allergies  Allergen Reactions  . Folic Acid Swelling    redness  . Hydrocodone Hives    Social History   Socioeconomic History  . Marital status: Single    Spouse name: Not on file  . Number of  children: Not on file  . Years of education: Not on file  . Highest education level: Not on file  Occupational History  . Not on file  Tobacco Use  . Smoking status: Current Every Day Smoker    Packs/day: 1.50    Types: Cigarettes  . Smokeless tobacco: Never Used  . Tobacco comment: 3/4 pack daily  Substance and Sexual Activity  . Alcohol use: Yes    Alcohol/week: 3.0 standard drinks    Types: 3 Standard drinks or equivalent per week    Comment: 5-6 beer daily  . Drug use: No  . Sexual activity: Not on file  Other Topics Concern  . Not on file  Social History Narrative  . Not on file   Social Determinants of Health   Financial Resource Strain:   . Difficulty of Paying Living Expenses:   Food Insecurity:   . Worried About Charity fundraiser in the Last Year:   . Arboriculturist in the Last Year:   Transportation Needs:   . Film/video editor (Medical):   Marland Kitchen Lack of Transportation (Non-Medical):   Physical Activity:   . Days of Exercise per Week:   . Minutes of Exercise per Session:   Stress:   . Feeling of Stress :   Social Connections:   . Frequency of Communication with Friends and Family:   . Frequency of Social Gatherings with Friends and Family:   . Attends Religious Services:   . Active Member of Clubs or Organizations:   . Attends Archivist Meetings:   Marland Kitchen Marital Status:   Intimate Partner Violence:   . Fear of Current or Ex-Partner:   . Emotionally Abused:   Marland Kitchen Physically Abused:   . Sexually Abused:      ROS- All systems are reviewed and negative except as per the HPI above.  Physical Exam: Vitals:   12/30/19 0836  BP: (!) 180/100  Pulse: (!) 123  Weight: 104.6 kg  Height: 6' (1.829 m)    GEN- The patient is well appearing obese male, alert and oriented x 3 today.   Head- normocephalic, atraumatic Eyes-  Sclera clear, conjunctiva pink Ears- hearing intact Oropharynx- clear Neck- supple  Lungs- Clear to ausculation bilaterally,  normal work of breathing Heart- irregular rate and rhythm, tachycardia, no murmurs, rubs or gallops  GI- soft, NT, ND, + BS Extremities- no clubbing, cyanosis, or edema MS- no significant deformity or atrophy Skin- no rash or lesion Psych- euthymic mood, full affect Neuro- strength and sensation are intact  Wt Readings from Last 3 Encounters:  12/30/19 104.6 kg  12/19/19 102.5 kg  10/27/19 105.2 kg    EKG today demonstrates afib HR 123, QRS 70, QTc 472  Echo 12/18/19 demonstrated  1. Left ventricular ejection fraction, by estimation, is 50%. The left  ventricle has low normal function. The left ventricle has no regional wall  motion abnormalities. There is moderate left ventricular hypertrophy. Left  ventricular diastolic parameters  are indeterminate.  2. Right ventricular systolic function is normal. The right ventricular  size is normal. Tricuspid regurgitation signal is inadequate for assessing  PA pressure.  3. Left atrial size was mildly dilated.  4. Right atrial size was mildly dilated.  5. The mitral valve is normal in structure. Trivial mitral valve  regurgitation. No evidence of mitral stenosis.  6. The aortic valve is normal in structure. Aortic valve regurgitation is  not visualized. No aortic stenosis is present.  7. Aortic dilatation noted. There is borderline dilatation of the aortic  root measuring 39 mm.  8. The inferior vena cava is normal in size with greater than 50%  respiratory variability, suggesting right atrial pressure of 3 mmHg.   Epic records are reviewed at length today  CHA2DS2-VASc Score = 1  The patient's score is based upon: CHF History: 0 HTN History: 1 Age : 0 Diabetes History: 0 Stroke History: 0 Vascular Disease History: 0 Gender: 0      ASSESSMENT AND PLAN: 1. Persistent Atrial Fibrillation (ICD10:  I48.19) The patient's CHA2DS2-VASc score is 1, indicating a 0.6% annual risk of stroke.   Patient remains in rapid afib  today.  Will have patient start diltiazem 240 mg BID per Dr Jackalyn Lombard recommendations. Continue Xarelto 20 mg daily. Patient denies any missed doses in the last 3 weeks. We also discussed AAD therapy today including flecainide and amiodarone. Will continue with rate control for now as patient is working on Copy. Lifestyle modification was discussed including ETOH cessation.  2. Obesity Body mass index is 31.27 kg/m. Lifestyle modification was discussed at length including regular exercise and weight reduction.  3. HTN Elevated today, increase diltiazem as above.   Follow up in the AF clinic later this week for ECG.    [facility] [address] [place], [place] [address] [phone] 12/30/2019 9:16 AM

## 2019-12-30 ENCOUNTER — Other Ambulatory Visit: Payer: Self-pay

## 2019-12-30 ENCOUNTER — Encounter (HOSPITAL_COMMUNITY): Payer: Self-pay | Admitting: Physician Assistant

## 2019-12-30 ENCOUNTER — Ambulatory Visit (HOSPITAL_COMMUNITY)
Admission: RE | Admit: 2019-12-30 | Discharge: 2019-12-30 | Disposition: A | Discharge status: Home / Self Care | Payer: Self-pay | Source: Ambulatory Visit | Attending: Physician Assistant | Admitting: Physician Assistant

## 2019-12-30 VITALS — BP 180/100 | HR 123 | Ht 72.0 in | Wt 230.6 lb

## 2019-12-30 DIAGNOSIS — Z7901 Long term (current) use of anticoagulants: Secondary | ICD-10-CM | POA: Insufficient documentation

## 2019-12-30 DIAGNOSIS — Z885 Allergy status to narcotic agent status: Secondary | ICD-10-CM | POA: Insufficient documentation

## 2019-12-30 DIAGNOSIS — Z8249 Family history of ischemic heart disease and other diseases of the circulatory system: Secondary | ICD-10-CM | POA: Insufficient documentation

## 2019-12-30 DIAGNOSIS — Z7984 Long term (current) use of oral hypoglycemic drugs: Secondary | ICD-10-CM | POA: Insufficient documentation

## 2019-12-30 DIAGNOSIS — F17210 Nicotine dependence, cigarettes, uncomplicated: Secondary | ICD-10-CM | POA: Insufficient documentation

## 2019-12-30 DIAGNOSIS — I4819 Other persistent atrial fibrillation: Secondary | ICD-10-CM

## 2019-12-30 DIAGNOSIS — I10 Essential (primary) hypertension: Secondary | ICD-10-CM | POA: Insufficient documentation

## 2019-12-30 DIAGNOSIS — Z6831 Body mass index (BMI) 31.0-31.9, adult: Secondary | ICD-10-CM | POA: Insufficient documentation

## 2019-12-30 DIAGNOSIS — Z888 Allergy status to other drugs, medicaments and biological substances status: Secondary | ICD-10-CM | POA: Insufficient documentation

## 2019-12-30 DIAGNOSIS — Z79899 Other long term (current) drug therapy: Secondary | ICD-10-CM | POA: Insufficient documentation

## 2019-12-30 DIAGNOSIS — E669 Obesity, unspecified: Secondary | ICD-10-CM | POA: Insufficient documentation

## 2019-12-31 NOTE — Progress Notes (Signed)
Patient ID: FRANDY BASNETT, male   DOB: [birth year], 49 y.o.   MRN: [id number]    Faruq Rosenberger, is a 49 y.o. male  [id number]:[id number]  ESP:[id number]  DOB - 1971-01-01  Subjective:  Chief Complaint and HPI: Zacharias Ridling is a 49 y.o. male here today for a follow up visit After hospitalization 7/1-12/20/2019.  Wife is with him. Unable to be cardioverted at this time bc no insurance.  He is being followed by afib clinic.  No CP or SOB.  +still smoking.  Says he has cut back from 6 drinks of alcohol daily to 1-2.    Multiple issues today: -wants to increase gabapentin-has been taking '600mg'$  tid and runs out bc not prescribed that does.  -tooth abscess.  Painful X 2 days.  No fever.    -still problems with leg swelling  -still drinking 1-2 alcohol drinks daily.  Interested in naltrexone for alcohol cravings.  He is not going to [place].  Unable to sleep.  He feels that he wouldn't need to drink alcohol if he could sleep  -he is not taking metformin bc it upset his stomach.  FBG this morning at home was 106  From discharge summary:  Disposition and follow-up:   Mr.Brenin D Touch was discharged from [facility] in Stable condition.  At the hospital follow up visit please address:  1. A.  Atrial fibrillation with RVR: Discharged on diltiazem 240 mg twice daily and asked to follow-up with the A. fib clinic  B.  Alcohol use: Counseled on alcohol use and correlation with atrial fibrillation.  Advised to follow-up with PCP to discuss starting naltrexone  2.  Labs / imaging needed at time of follow-up: None  3.  Pending labs/ test needing follow-up: None  Hospital Course by problem list: 1.  Atrial fibrillation with RVR: Mr. Waymire is a 49 year old gentleman with medical history significant for atrial fibrillation on Xarelto, hypertension, alcohol and tobacco use who presented to the hospital with a 2-week history of dyspnea on exertion, palpitation and dizziness.  He was found to  be in A. fib with RVR and was initially started on IV diltiazem which was subsequently transitioned to oral.  He was evaluated by our electrophysiology team who recommended to start p.o. diltiazem 240 mg twice daily and asked to follow-up with the atrial fibrillation clinic.  An echocardiogram was performed which showed LVEF 50% without any structural heart abnormalities with exception of left ventricular hypertrophy.  2. Alcohol use: He states that he began drinking since age 50 and drinks about 68 mg a day contain about 10% alcohol.  He reports that he drinks only at night to help him sleep.  He expressed desire to quit and received much counseling from [place].  He was asked to follow-up with this PCP to discuss initiating naltrexone.  Marland Kitchen   ED/Hospital notes reviewed.    ROS:   Constitutional:  No f/c, No night sweats, No unexplained weight loss. EENT:  No vision changes, No blurry vision, No hearing changes. No mouth, throat, or ear problems.  Respiratory: No cough, No SOB Cardiac: No CP, no palpitations GI:  No abd pain, No N/V/D. GU: No Urinary s/sx Musculoskeletal: No joint pain Neuro: No headache, no dizziness, no motor weakness.  Skin: No rash Endocrine:  No polydipsia. No polyuria.  Psych: Denies SI/HI  No problems updated.  ALLERGIES: Allergies  Allergen Reactions  . Folic Acid Swelling    redness  . Hydrocodone Hives  PAST MEDICAL HISTORY: Past Medical History:  Diagnosis Date  . Atrial fibrillation ([place])   . Hypertension     MEDICATIONS AT HOME: Prior to Admission medications   Medication Sig Start Date End Date Taking? Authorizing Provider  albuterol (VENTOLIN HFA) 108 (90 Base) MCG/ACT inhaler Inhale 2 puffs into the lungs every 6 (six) hours as needed for wheezing or shortness of breath. 01/07/20   Micheal Murad, Dionne Bucy, PA-C  Aspirin-Acetaminophen-Caffeine (GOODY HEADACHE PO) Take 2 Packages by mouth daily.     [provider]  Blood Glucose Monitoring  Suppl (TRUE METRIX METER) w/Device KIT 1 kit by Does not apply route daily. Use to check fasting blood sugar daily. 10/01/19   [name], Cammie, MD  diltiazem (DILACOR XR) 120 MG 24 hr capsule Take 2 capsules (240 mg total) by mouth 2 (two) times daily. 01/07/20 02/06/20  Argentina Donovan, PA-C  furosemide (LASIX) 20 MG tablet One daily by mouth x 3 days then as needed for leg swelling 10/27/19   [name], Cammie, MD  gabapentin (NEURONTIN) 300 MG capsule Take 2 capsules (600 mg total) by mouth 3 (three) times daily. 01/07/20   Argentina Donovan, PA-C  glucose blood test strip Use to check fasting blood sugar daily. 10/01/19   [name], Cammie, MD  metFORMIN (GLUCOPHAGE-XR) 500 MG 24 hr tablet Take 1 tablet (500 mg total) by mouth daily after supper. 10/27/19   [name], Cammie, MD  naltrexone (DEPADE) 50 MG tablet Take 1/2 daily for 1 week then 1 daily.  Do not drink alcohol 01/07/20   Freeman Caldron M, PA-C  penicillin v potassium (VEETID) 500 MG tablet Take 1 tablet (500 mg total) by mouth 3 (three) times daily. 01/07/20   Argentina Donovan, PA-C  Potassium 99 MG TABS Take by mouth.    [provider]  rivaroxaban (XARELTO) 20 MG TABS tablet Take 1 tablet (20 mg total) by mouth daily with supper. 10/27/19   [name], Cammie, MD  traZODone (DESYREL) 50 MG tablet Take 1 tablet (50 mg total) by mouth at bedtime as needed for sleep. 01/07/20   Argentina Donovan, PA-C  TRUEplus Lancets 28G MISC Use to check fasting blood sugar daily. 10/01/19   [name], Cammie, MD  vitamin B-12 (CYANOCOBALAMIN) 1000 MCG tablet Take 1,000 mcg by mouth daily.     [provider]     Objective:  EXAM:   Vitals:   01/07/20 1009  BP: (!) 154/94  Pulse: 89  Resp: 16  Temp: 97.6 F (36.4 C)  SpO2: 97%  Weight: 237 lb (107.5 kg)  Height: 6' (1.829 m)    General appearance : A&OX3. NAD. Non-toxic-appearing HEENT: Atraumatic and Normocephalic.  PERRLA. EOM intact.   L incisor with erythema and swelling ant gum  base Chest/Lungs:  Breathing-non-labored, Good air entry bilaterally, br Neck: supple, no JVD. No cervical lymphadenopathy. N thyromegalyeath sounds normal without rales, rhonchi, or wheezing  CVS: S1 S2 irregular, no murmurs, gallops, rubs  Extremities: Bilateral Lower Ext shows 12+ edema, both legs are warm to touch with = pulse throughout Neurology:  CN II-XII grossly intact, Non focal.   Psych:  TP linear. J/I poor to fair. Normal speech. Appropriate eye contact and affect.  Skin:  No Rash  Data Review Lab Results  Component Value Date   HGBA1C 6.1 (H) 12/18/2019   HGBA1C 5.8 (H) 09/17/2019     Assessment & Plan   1. Hyperglycemia I have had a lengthy discussion and provided education about insulin resistance  and the intake of too much sugar/refined carbohydrates.  I have advised the patient to work at a goal of eliminating sugary drinks, candy, desserts, sweets, refined sugars, processed foods, and white carbohydrates.  The patient expresses understanding.   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  12 step meetings/recovery recommended.  Local 12 step meeting lists were given and attendance was encouraged.  Patient expresses understanding.   - traZODone (DESYREL) 50 MG tablet; Take 1 tablet (50 mg total) by mouth at bedtime as needed for sleep.  Dispense: 30 tablet; Refill: 3  3. Longstanding persistent atrial fibrillation (HCC) - diltiazem (DILACOR XR) 120 MG 24 hr capsule; Take 2 capsules (240 mg total) by mouth 2 (two) times daily.  Dispense: 120 capsule; Refill: 3  4. Essential hypertension Not at goal but only increased diltiazem about 3 days ago - Comprehensive metabolic panel  5. Peripheral edema unchanged - Comprehensive metabolic panel  6. Numbness in feet Ok to take at increased dose - gabapentin (NEURONTIN) 300 MG capsule; Take 2 capsules (600 mg total) by  mouth 3 (three) times daily.  Dispense: 180 capsule; Refill: 3  7. Alcohol abuse I have counseled the patient at length about substance abuse and addiction.  12 step meetings/recovery recommended.  Local 12 step meeting lists were given and attendance was encouraged.  Patient expresses understanding.  -many of his health issues are related to drinking/smoking.  I spent 40 mins face to face counseling him and his wife on this.  Also on diabetic diet since he is currently unwilling to take metformin - naltrexone (DEPADE) 50 MG tablet; Take 1/2 daily for 1 week then 1 daily.  Do not drink alcohol  Dispense: 30 tablet; Refill: 3  8. Wheezing Smoking cessation imperative - albuterol (VENTOLIN HFA) 108 (90 Base) MCG/ACT inhaler; Inhale 2 puffs into the lungs every 6 (six) hours as needed for wheezing or shortness of breath.  Dispense: 18 g; Refill: 1  9. Tooth abscess PCN '500mg'$  [id number].  Gave urgent dental forms  10. Hospital discharge follow-up See all above   Patient have been counseled extensively about nutrition and exercise  Return in about 2 months (around 03/09/2020) for PCP.  The patient was given clear instructions to go to ER or return to medical center if symptoms don't improve, worsen or new problems develop. The patient verbalized understanding. The patient was told to call to get lab results if they haven't heard anything in the next week.     Freeman Caldron, PA-C [facility] [place], [place]   01/07/2020, 10:44 AM

## 2020-01-02 ENCOUNTER — Encounter (HOSPITAL_COMMUNITY): Payer: Self-pay | Admitting: Physician Assistant

## 2020-01-02 ENCOUNTER — Encounter (HOSPITAL_COMMUNITY): Payer: Self-pay

## 2020-01-06 MED FILL — XARELTO 20 MG TABLET: 20 | 30 days supply | Qty: 30 | Fill #2

## 2020-01-06 MED FILL — FUROSEMIDE 20 MG TABS: 20 | 30 days supply | Qty: 30 | Fill #2

## 2020-01-07 ENCOUNTER — Encounter: Payer: Self-pay | Admitting: Physician Assistant

## 2020-01-07 ENCOUNTER — Ambulatory Visit: Payer: Self-pay | Attending: Physician Assistant | Admitting: Physician Assistant

## 2020-01-07 ENCOUNTER — Other Ambulatory Visit: Payer: Self-pay | Admitting: Physician Assistant

## 2020-01-07 ENCOUNTER — Other Ambulatory Visit: Payer: Self-pay

## 2020-01-07 VITALS — BP 154/94 | HR 89 | Temp 97.6°F | Resp 16 | Ht 72.0 in | Wt 237.0 lb

## 2020-01-07 DIAGNOSIS — I4811 Longstanding persistent atrial fibrillation: Secondary | ICD-10-CM

## 2020-01-07 DIAGNOSIS — F1010 Alcohol abuse, uncomplicated: Secondary | ICD-10-CM

## 2020-01-07 DIAGNOSIS — Z09 Encounter for follow-up examination after completed treatment for conditions other than malignant neoplasm: Secondary | ICD-10-CM

## 2020-01-07 DIAGNOSIS — R200 Anesthesia of skin: Secondary | ICD-10-CM

## 2020-01-07 DIAGNOSIS — I10 Essential (primary) hypertension: Secondary | ICD-10-CM

## 2020-01-07 DIAGNOSIS — R062 Wheezing: Secondary | ICD-10-CM

## 2020-01-07 DIAGNOSIS — K047 Periapical abscess without sinus: Secondary | ICD-10-CM

## 2020-01-07 DIAGNOSIS — F10982 Alcohol use, unspecified with alcohol-induced sleep disorder: Secondary | ICD-10-CM

## 2020-01-07 DIAGNOSIS — R609 Edema, unspecified: Secondary | ICD-10-CM

## 2020-01-07 DIAGNOSIS — R739 Hyperglycemia, unspecified: Secondary | ICD-10-CM

## 2020-01-07 MED ORDER — DILTIAZEM HCL ER 120 MG PO CP24: 240.0000 mg | ORAL_CAPSULE | Freq: Two times a day (BID) | ORAL | 3 refills | Status: DC

## 2020-01-07 MED ORDER — TRAZODONE HCL 50 MG PO TABS: 50.0000 mg | ORAL_TABLET | Freq: Every evening | ORAL | 3 refills | Status: DC | PRN

## 2020-01-07 MED ORDER — NALTREXONE HCL 50 MG PO TABS: ORAL_TABLET | ORAL | 3 refills | Status: DC

## 2020-01-07 MED ORDER — GABAPENTIN 300 MG PO CAPS: 600.0000 mg | ORAL_CAPSULE | Freq: Three times a day (TID) | ORAL | 3 refills | Status: DC

## 2020-01-07 MED ORDER — PENICILLIN V POTASSIUM 500 MG PO TABS: 500.0000 mg | ORAL_TABLET | Freq: Three times a day (TID) | ORAL | 3 refills | Status: DC

## 2020-01-07 MED ORDER — ALBUTEROL SULFATE HFA 108 (90 BASE) MCG/ACT IN AERS: 2.0000 | INHALATION_SPRAY | Freq: Four times a day (QID) | RESPIRATORY_TRACT | 1 refills | Status: DC | PRN

## 2020-01-07 MED FILL — GABAPENTIN 300 MG CAPSULE: 300 | 30 days supply | Qty: 180 | Fill #0

## 2020-01-07 MED FILL — PENICILLIN VK 500 MG TABLET: 500 | 10 days supply | Qty: 30 | Fill #0

## 2020-01-07 MED FILL — CARTIA XT 120 MG CP24: 120 | 30 days supply | Qty: 120 | Fill #0

## 2020-01-07 MED FILL — !VENTOLIN HFA INHALER: 108 (90 BAS | 25 days supply | Qty: 18 | Fill #0

## 2020-01-07 MED FILL — traZODone HCL 50 MG TABS: 50 | 30 days supply | Qty: 30 | Fill #0

## 2020-01-07 MED FILL — NALTREXONE 50 MG TABLET: 50 | 30 days supply | Qty: 30 | Fill #0

## 2020-01-07 NOTE — Progress Notes (Signed)
ED f /u  BG recorded by pt 106 fasting /

## 2020-01-07 NOTE — Patient Instructions (Signed)
[email or web address] has AA meetings and phone numbers available to help with stopping alcohol use

## 2020-01-08 LAB — COMPREHENSIVE METABOLIC PANEL
ALT: 21 IU/L (ref 0–44)
AST: 39 IU/L (ref 0–40)
Albumin/Globulin Ratio: 1.2 (ref 1.2–2.2)
Albumin: 3.6 g/dL — ABNORMAL LOW (ref 4.0–5.0)
Alkaline Phosphatase: 101 IU/L (ref 48–121)
BUN/Creatinine Ratio: 8 — ABNORMAL LOW (ref 9–20)
BUN: 7 mg/dL (ref 6–24)
Bilirubin Total: 0.5 mg/dL (ref 0.0–1.2)
CO2: 23 mmol/L (ref 20–29)
Calcium: 8.8 mg/dL (ref 8.7–10.2)
Chloride: 100 mmol/L (ref 96–106)
Creatinine, Ser: 0.87 mg/dL (ref 0.76–1.27)
GFR calc Af Amer: 117 mL/min/{1.73_m2} (ref >59)
GFR calc non Af Amer: 101 mL/min/{1.73_m2} (ref >59)
Globulin, Total: 3.1 g/dL (ref 1.5–4.5)
Glucose: 131 mg/dL — ABNORMAL HIGH (ref 65–99)
Potassium: 3.5 mmol/L (ref 3.5–5.2)
Sodium: 141 mmol/L (ref 134–144)
Total Protein: 6.7 g/dL (ref 6.0–8.5)

## 2020-02-06 MED FILL — ?FUROSEMIDE 20MG TABLET: 20 | 30 days supply | Qty: 30 | Fill #3

## 2020-02-06 MED FILL — GABAPENTIN 300 MG CAPSULE: 300 | 30 days supply | Qty: 180 | Fill #1

## 2020-02-06 MED FILL — !VENTOLIN HFA INHALER: 108 (90 BAS | 25 days supply | Qty: 18 | Fill #1

## 2020-02-06 MED FILL — PENICILLIN VK 500 MG TABLET: 500 | 10 days supply | Qty: 30 | Fill #1

## 2020-02-06 MED FILL — CARTIA XT 120 MG CP24: 120 | 30 days supply | Qty: 120 | Fill #1

## 2020-02-06 MED FILL — XARELTO 20 MG TABLET: 20 | 30 days supply | Qty: 30 | Fill #3

## 2020-03-05 ENCOUNTER — Other Ambulatory Visit: Payer: Self-pay | Admitting: Family Medicine

## 2020-03-05 DIAGNOSIS — R609 Edema, unspecified: Secondary | ICD-10-CM

## 2020-03-05 MED FILL — ?DILTIAZEM 24HR CD 120MG CA: 120 | 30 days supply | Qty: 120 | Fill #2

## 2020-03-05 MED FILL — FUROSEMIDE 20 MG TABS: 20 | 30 days supply | Qty: 30 | Fill #0

## 2020-03-05 MED FILL — XARELTO 20 MG TABLET: 20 | 30 days supply | Qty: 30 | Fill #4

## 2020-03-05 MED FILL — GABAPENTIN 300 MG CAPSULE: 300 | 30 days supply | Qty: 180 | Fill #2

## 2020-03-05 MED FILL — PENICILLIN VK 500 MG TABLET: 500 | 10 days supply | Qty: 30 | Fill #2

## 2020-03-09 ENCOUNTER — Ambulatory Visit: Payer: Self-pay | Attending: Nurse Practitioner | Admitting: Nurse Practitioner

## 2020-03-09 ENCOUNTER — Encounter: Payer: Self-pay | Admitting: Nurse Practitioner

## 2020-03-09 ENCOUNTER — Other Ambulatory Visit: Payer: Self-pay | Admitting: Nurse Practitioner

## 2020-03-09 DIAGNOSIS — I4811 Longstanding persistent atrial fibrillation: Secondary | ICD-10-CM

## 2020-03-09 DIAGNOSIS — G621 Alcoholic polyneuropathy: Secondary | ICD-10-CM

## 2020-03-09 DIAGNOSIS — F10982 Alcohol use, unspecified with alcohol-induced sleep disorder: Secondary | ICD-10-CM

## 2020-03-09 MED ORDER — DILTIAZEM HCL ER 120 MG PO CP24: 240.0000 mg | ORAL_CAPSULE | Freq: Two times a day (BID) | ORAL | 0 refills | Status: DC

## 2020-03-09 MED ORDER — DULOXETINE HCL 30 MG PO CPEP: 30.0000 mg | ORAL_CAPSULE | Freq: Every day | ORAL | 0 refills | Status: DC

## 2020-03-09 MED ORDER — TRAZODONE HCL 50 MG PO TABS: 50.0000 mg | ORAL_TABLET | Freq: Every evening | ORAL | 3 refills | Status: DC | PRN

## 2020-03-09 MED FILL — DULoxetine HCL 30 MG CPEP: 30 | 30 days supply | Qty: 30 | Fill #0

## 2020-03-09 NOTE — Progress Notes (Signed)
Virtual Visit via Telephone Note Due to national recommendations of social distancing due to COVID 19, telehealth visit is felt to be most appropriate for this patient at this time.  I discussed the limitations, risks, security and privacy concerns of performing an evaluation and management service by telephone and the availability of in person appointments. I also discussed with the patient that there may be a patient responsible charge related to this service. The patient expressed understanding and agreed to proceed.    I connected with Kevin Duncan on 03/09/20  at   8:30 AM EDT  EDT by telephone and verified that I am speaking with the correct person using two identifiers.   Consent I discussed the limitations, risks, security and privacy concerns of performing an evaluation and management service by telephone and the availability of in person appointments. I also discussed with the patient that there may be a patient responsible charge related to this service. The patient expressed understanding and agreed to proceed.   Location of Patient: Private Residence    [facility] of Provider: [facility]    Persons participating in Telemedicine visit: Bertram Denver FNP-BC YY Bien CMA Jerilynn Som D Pryce    History of Present Illness: Telemedicine visit for: Neuropathy  has a past medical history of Atrial fibrillation (HCC), Hypertension, and Prediabetes.  Endorses Numbness and tingling in both feet. Onset 1 year ago. He had an increase of gabapentin to  600 mg TID in July for his symptoms which he reports today has only minimally been effective. He does have a history of significant alcohol abuse but states he has been abstaining from alcohol. 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 Will add cymbalta as he is no longer drinking alcohol. PCP also recommended a B12 supplement which he endorses taking.   He also wants to know since he stopped drinking alcohol  will he have to continue taking "some of these medications". I discussed the reasons for his current medications along with his diagnosis of afib. He is aware these are mostly lifetime medications.  Denies chest pain, shortness of breath, palpitations, lightheadedness, dizziness, headaches or BLE edema.   Past Medical History:  Diagnosis Date   Atrial fibrillation (HCC)    Hypertension    Prediabetes     Past Surgical History:  Procedure Laterality Date   THUMB FUSION Bilateral     Family History  Problem Relation Age of Onset   Diabetes Mother    Heart disease Father    Heart disease Brother    Diabetes Maternal Grandmother    Heart disease Paternal Grandmother     Social History   Socioeconomic History   Marital status: Single    Spouse name: Not on file   Number of children: Not on file   Years of education: Not on file   Highest education level: Not on file  Occupational History   Not on file  Tobacco Use   Smoking status: Current Every Day Smoker    Packs/day: 1.50    Types: Cigarettes   Smokeless tobacco: Never Used   Tobacco comment: 3/4 pack daily  Substance and Sexual Activity   Alcohol use: Not Currently    Alcohol/week: 3.0 standard drinks    Types: 3 Standard drinks or equivalent per week    Comment: 5-6 beer daily   Drug use: No   Sexual activity: Not on file  Other Topics Concern   Not on file  Social History Narrative  Not on file   Social Determinants of Health   Financial Resource Strain:    Difficulty of Paying Living Expenses: Not on file  Food Insecurity:    Worried About Running Out of Food in the Last Year: Not on file   Ran Out of Food in the Last Year: Not on file  Transportation Needs:    Lack of Transportation (Medical): Not on file   Lack of Transportation (Non-Medical): Not on file  Physical Activity:    Days of Exercise per Week: Not on file   Minutes of Exercise per Session: Not on file  Stress:     Feeling of Stress : Not on file  Social Connections:    Frequency of Communication with Friends and Family: Not on file   Frequency of Social Gatherings with Friends and Family: Not on file   Attends Religious Services: Not on file   Active Member of Clubs or Organizations: Not on file   Attends Banker Meetings: Not on file   Marital Status: Not on file     Observations/Objective: Awake, alert and oriented x 3   Review of Systems  Constitutional: Negative for fever, malaise/fatigue and weight loss.  HENT: Negative.  Negative for nosebleeds.   Eyes: Negative.  Negative for blurred vision, double vision and photophobia.  Respiratory: Negative.  Negative for cough and shortness of breath.   Cardiovascular: Negative.  Negative for chest pain, palpitations and leg swelling.  Gastrointestinal: Negative.  Negative for heartburn, nausea and vomiting.  Musculoskeletal: Negative.  Negative for myalgias.  Neurological: Positive for tingling and sensory change. Negative for dizziness, focal weakness, seizures and headaches.  Psychiatric/Behavioral: Negative for suicidal ideas. The patient has insomnia.     Assessment and Plan: Kevin Duncan was seen today for follow-up.  Diagnoses and all orders for this visit:  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HCC) -     DULoxetine (CYMBALTA) 30 MG capsule; Take 1 capsule (30 mg total) by mouth daily.  Alcohol-induced insomnia (HCC) -     traZODone (DESYREL) 50 MG tablet; Take 1 tablet (50 mg total) by mouth at bedtime as needed for sleep.  Longstanding persistent atrial fibrillation (HCC) -     diltiazem (DILACOR XR) 120 MG 24 hr capsule; Take 2 capsules (240 mg total) by mouth 2 (two) times daily. -     DULoxetine (CYMBALTA) 30 MG capsule; Take 1 capsule (30 mg total) by mouth daily.     Follow Up Instructions Return in about 3 months (around 06/08/2020).     I discussed the assessment and treatment plan with the patient. The  patient was provided an opportunity to ask questions and all were answered. The patient agreed with the plan and demonstrated an understanding of the instructions.   The patient was advised to call back or seek an in-person evaluation if the symptoms worsen or if the condition fails to improve as anticipated.  I provided 19 minutes of non-face-to-face time during this encounter including median intraservice time, reviewing previous notes, labs, imaging, medications and explaining diagnosis and management.  Claiborne Rigg, FNP-BC

## 2020-04-05 MED FILL — DILTIAZEM 24HR ER 120 MG CA: 120 | 30 days supply | Qty: 120 | Fill #3

## 2020-04-05 MED FILL — PENICILLIN VK 500 MG TABLET: 500 | 10 days supply | Qty: 30 | Fill #3

## 2020-04-05 MED FILL — XARELTO 20 MG TABLET: 20 | 30 days supply | Qty: 30 | Fill #5

## 2020-04-05 MED FILL — GABAPENTIN 300 MG CAPSULE: 300 | 30 days supply | Qty: 180 | Fill #3

## 2020-04-05 MED FILL — DULoxetine HCL 30 MG CPEP: 30 | 30 days supply | Qty: 30 | Fill #1

## 2020-04-05 MED FILL — FUROSEMIDE 20 MG TABS: 20 | 30 days supply | Qty: 30 | Fill #1

## 2020-05-06 ENCOUNTER — Other Ambulatory Visit: Payer: Self-pay | Admitting: Family Medicine

## 2020-05-06 ENCOUNTER — Other Ambulatory Visit: Payer: Self-pay | Admitting: Physician Assistant

## 2020-05-06 ENCOUNTER — Other Ambulatory Visit: Payer: Self-pay | Admitting: Critical Care Medicine

## 2020-05-06 DIAGNOSIS — I4811 Longstanding persistent atrial fibrillation: Secondary | ICD-10-CM

## 2020-05-06 DIAGNOSIS — R200 Anesthesia of skin: Secondary | ICD-10-CM

## 2020-05-06 MED FILL — DILTIAZEM 24HR ER 120 MG CA: 120 | 30 days supply | Qty: 120 | Fill #0

## 2020-05-06 MED FILL — DULoxetine HCL 30 MG CPEP: 30 | 30 days supply | Qty: 30 | Fill #2

## 2020-05-06 MED FILL — GABAPENTIN 300 MG CAPSULE: 300 | 30 days supply | Qty: 180 | Fill #0

## 2020-05-06 MED FILL — XARELTO 20 MG TABLET: 20 | 30 days supply | Qty: 30 | Fill #6

## 2020-05-06 MED FILL — FUROSEMIDE 20 MG TABS: 20 | 30 days supply | Qty: 30 | Fill #2

## 2020-05-06 NOTE — Telephone Encounter (Signed)
° °  Future visit scheduled: yes  Notes to clinic:  penicillin v potassium (VEETID) 500 MG tablet Patient is having tooth pain and would like another round of antibioitc    Requested Prescriptions  Pending Prescriptions Disp Refills   diltiazem (DILACOR XR) 120 MG 24 hr capsule 360 capsule 0    Sig: Take 2 capsules (240 mg total) by mouth 2 (two) times daily.      Cardiovascular:  Calcium Channel Blockers Failed - 05/06/2020 11:10 AM      Failed - Last BP in normal range    BP Readings from Last 1 Encounters:  01/07/20 (!) 154/94          Passed - Valid encounter within last 6 months    Recent Outpatient Visits           1 month ago Alcohol-induced polyneuropathy [facility])   [facility] [place], Shea Stakes, NP   4 months ago Hyperglycemia   [facility] [place], [place], [place]   6 months ago Longstanding persistent atrial fibrillation [facility])   [place] [facility] [name], MD   7 months ago Prediabetes   [facility] [place], Cornelius Moras, RPH-CPP   8 months ago Longstanding persistent atrial fibrillation [facility])   [place] [facility] Cain Saupe, MD       Future Appointments             In 1 month Delford Field Charlcie Cradle, MD [facility]

## 2020-05-06 NOTE — Telephone Encounter (Signed)
Medication Refill - Medication: diltiazem (DILACOR XR) 120 MG 24 hr capsule   gabapentin (NEURONTIN) 300 MG capsule /pt will be out of meds tomorrow   Pt teeth are bothering him again and he needs another round of penicillin v potassium (VEETID) 500 MG tablet Please advise   Has the patient contacted their pharmacy? Yes.   (Agent: If no, request that the patient contact the pharmacy for the refill.) (Agent: If yes, when and what did the pharmacy advise?)  Preferred Pharmacy (with phone number or street name):  [facility] [place], [place] - [place] [address]  [address], [place] [address]  Phone:  [phone] Fax:  [phone]  Agent: Please be advised that RX refills may take up to 3 business days. We ask that you follow-up with your pharmacy.

## 2020-05-07 ENCOUNTER — Other Ambulatory Visit: Payer: Self-pay | Admitting: Family Medicine

## 2020-05-07 MED ORDER — DILTIAZEM HCL ER 120 MG PO CP24: 240.0000 mg | ORAL_CAPSULE | Freq: Two times a day (BID) | ORAL | 0 refills | Status: DC

## 2020-06-07 ENCOUNTER — Other Ambulatory Visit: Payer: Self-pay | Admitting: Critical Care Medicine

## 2020-06-07 ENCOUNTER — Other Ambulatory Visit: Payer: Self-pay | Admitting: Nurse Practitioner

## 2020-06-07 DIAGNOSIS — I4811 Longstanding persistent atrial fibrillation: Secondary | ICD-10-CM

## 2020-06-07 DIAGNOSIS — G621 Alcoholic polyneuropathy: Secondary | ICD-10-CM

## 2020-06-07 DIAGNOSIS — R200 Anesthesia of skin: Secondary | ICD-10-CM

## 2020-06-07 MED FILL — XARELTO 20 MG TABLET: 20 | 30 days supply | Qty: 30 | Fill #7

## 2020-06-07 MED FILL — GABAPENTIN 300 MG CAPSULE: 300 | 30 days supply | Qty: 180 | Fill #0

## 2020-06-07 MED FILL — DILTIAZEM 24HR ER 120 MG CA: 120 | 30 days supply | Qty: 120 | Fill #1

## 2020-06-07 MED FILL — DULoxetine HCL 30 MG CPEP: 30 | 30 days supply | Qty: 30 | Fill #0

## 2020-06-07 MED FILL — FUROSEMIDE 20 MG TABS: 20 | 30 days supply | Qty: 30 | Fill #3

## 2020-06-07 NOTE — Telephone Encounter (Signed)
Requested Prescriptions  Pending Prescriptions Disp Refills  . gabapentin (NEURONTIN) 300 MG capsule [Pharmacy Med Name: GABAPENTIN 300 MG CAPSULE 300 Capsule] 180 capsule 0    Sig: TAKE 2 CAPSULES (600 MG TOTAL) BY MOUTH 3 (THREE) TIMES DAILY.     Neurology: Anticonvulsants - gabapentin Passed - 06/07/2020  8:54 AM      Passed - Valid encounter within last 12 months    Recent Outpatient Visits          3 months ago Alcohol-induced polyneuropathy [facility])   [facility] [place], Shea Stakes, NP   5 months ago Hyperglycemia   [facility] [place], [place], [place]   7 months ago Longstanding persistent atrial fibrillation [facility])   [place] [facility] [name], [place], MD   8 months ago Prediabetes   [facility] [place], RPH-CPP   9 months ago Longstanding persistent atrial fibrillation [facility])   [place] [facility] Cain Saupe, MD      Future Appointments            In 2 days Storm Frisk, MD [facility]

## 2020-06-09 ENCOUNTER — Ambulatory Visit: Payer: Self-pay | Admitting: Family Medicine

## 2020-06-09 ENCOUNTER — Encounter: Payer: Self-pay | Admitting: Critical Care Medicine

## 2020-06-09 ENCOUNTER — Other Ambulatory Visit: Payer: Self-pay

## 2020-06-09 ENCOUNTER — Other Ambulatory Visit: Payer: Self-pay | Admitting: Critical Care Medicine

## 2020-06-09 ENCOUNTER — Ambulatory Visit: Payer: Self-pay | Attending: Family Medicine | Admitting: Critical Care Medicine

## 2020-06-09 VITALS — BP 151/92 | HR 81 | Ht 72.0 in | Wt 243.0 lb

## 2020-06-09 DIAGNOSIS — Z7901 Long term (current) use of anticoagulants: Secondary | ICD-10-CM

## 2020-06-09 DIAGNOSIS — F1010 Alcohol abuse, uncomplicated: Secondary | ICD-10-CM

## 2020-06-09 DIAGNOSIS — I4811 Longstanding persistent atrial fibrillation: Secondary | ICD-10-CM

## 2020-06-09 DIAGNOSIS — G621 Alcoholic polyneuropathy: Secondary | ICD-10-CM

## 2020-06-09 DIAGNOSIS — Z1159 Encounter for screening for other viral diseases: Secondary | ICD-10-CM

## 2020-06-09 DIAGNOSIS — I10 Essential (primary) hypertension: Secondary | ICD-10-CM

## 2020-06-09 DIAGNOSIS — Z1211 Encounter for screening for malignant neoplasm of colon: Secondary | ICD-10-CM

## 2020-06-09 DIAGNOSIS — F17210 Nicotine dependence, cigarettes, uncomplicated: Secondary | ICD-10-CM

## 2020-06-09 MED ORDER — DULOXETINE HCL 30 MG PO CPEP: 30.0000 mg | ORAL_CAPSULE | Freq: Every day | ORAL | 1 refills | Status: DC

## 2020-06-09 MED ORDER — NICOTINE POLACRILEX 4 MG MT LOZG
LOZENGE | OROMUCOSAL | 4 refills | Status: DC
Start: 2020-06-09 — End: 2020-08-31

## 2020-06-09 MED ORDER — VALSARTAN 160 MG PO TABS: 160.0000 mg | ORAL_TABLET | Freq: Every day | ORAL | 1 refills | Status: DC

## 2020-06-09 MED ORDER — NICOTINE POLACRILEX 4 MG MT LOZG: LOZENGE | OROMUCOSAL | 4 refills | Status: DC

## 2020-06-09 MED ORDER — NICOTINE POLACRILEX 4 MG MT LOZG
LOZENGE | OROMUCOSAL | 4 refills | Status: DC
Start: 2020-06-09 — End: 2020-06-09

## 2020-06-09 MED ORDER — RIVAROXABAN 20 MG PO TABS
20.0000 mg | ORAL_TABLET | Freq: Every day | ORAL | 11 refills | Status: DC
  Filled 2020-10-07: qty 30, 30d supply, fill #0
  Filled 2020-11-02: qty 30, 30d supply, fill #1
  Filled 2020-11-29: qty 30, 30d supply, fill #2
  Filled 2020-12-29: qty 30, 30d supply, fill #3
  Filled 2021-02-01: qty 30, 30d supply, fill #4
  Filled 2021-03-01: qty 30, 30d supply, fill #5
  Filled 2021-05-02: qty 30, 30d supply, fill #6

## 2020-06-09 MED ORDER — THIAMINE HCL 100 MG PO TABS
100.0000 mg | ORAL_TABLET | Freq: Every day | ORAL | 1 refills | Status: DC
Start: 2020-06-09 — End: 2020-08-31

## 2020-06-09 MED FILL — SM NICOTINE 4 MG LOZENGE: 4 | 24 days supply | Qty: 72 | Fill #0

## 2020-06-09 MED FILL — ?VALSARTAN 160MG TABLET: 160 | 30 days supply | Qty: 30 | Fill #0

## 2020-06-09 NOTE — Assessment & Plan Note (Signed)
  .   Current smoking consumption amount: Pack a day  . Dicsussion on advise to quit smoking and smoking impacts: Cardiovascular lung health impacts  . Patient's willingness to quit: Interested in quitting  . Methods to quit smoking discussed: Nicotine replacement behavioral modification  . Medication management of smoking session drugs discussed: Nicotine lozenges  . Resources provided:  AVS   . Setting quit date not established  . Follow-up arranged 6 weeks   Time spent counseling the patient: 5 minutes

## 2020-06-09 NOTE — Patient Instructions (Signed)
Stop naltrexone Start valsartan 1 daily for blood pressure Refills on all your other medications sent to our pharmacy Start thiamine 100 mg daily vitamin Continue your other vitamin supplements Stop using Goody powders instead take Tylenol alone for pain  Stop all alcohol use and we will connect you with Jasmine our clinical social worker for counseling  Reduce tobacco intake using nicotine replacement therapy I recommend the nicotine lozenges a prescription sent to our pharmacy  Referral back to the atrial fibrillation clinic was made  Colon cancer screening kit will be given along with a number of other labs will be sent today including metabolic panel and blood counts hepatitis C screen  We discussed the importance of obtaining a Covid vaccine below is information as to the phone number we can seek the vaccine I recommend you the Sabina or moderna vaccine   Alcohol Abuse and Dependence Information, Adult Alcohol is a widely available drug. People drink alcohol in different amounts. People who drink alcohol very often and in large amounts often have problems during and after drinking. They may develop what is called an alcohol use disorder. There are two main types of alcohol use disorders:  Alcohol abuse. This is when you use alcohol too much or too often. You may use alcohol to make yourself feel happy or to reduce stress. You may have a hard time setting a limit on the amount you drink.  Alcohol dependence. This is when you use alcohol consistently for a period of time, and your body changes as a result. This can make it hard to stop drinking because you may start to feel sick or feel different when you do not use alcohol. These symptoms are known as withdrawal. How can alcohol abuse and dependence affect me? Alcohol abuse and dependence can have a negative effect on your life. Drinking too much can lead to addiction. You may feel like you need alcohol to function normally. You may  drink alcohol before work in the morning, during the day, or as soon as you get home from work in the evening. These actions can result in:  Poor work performance.  Job loss.  Financial problems.  Car crashes or criminal charges from driving after drinking alcohol.  Problems in your relationships with friends and family.  Losing the trust and respect of coworkers, friends, and family. Drinking heavily over a long period of time can permanently damage your body and brain, and can cause lifelong health issues, such as:  Damage to your liver or pancreas.  Heart problems, high blood pressure, or stroke.  Certain cancers.  Decreased ability to fight infections.  Brain or nerve damage.  Depression.  Early (premature) death. If you are careless or you crave alcohol, it is easy to drink more than your body can handle (overdose). Alcohol overdose is a serious situation that requires hospitalization. It may lead to permanent injuries or death. What can increase my risk?  Having a family history of alcohol abuse.  Having depression or other mental health conditions.  Beginning to drink at an early age.  Binge drinking often.  Experiencing trauma, stress, and an unstable home life during childhood.  Spending time with people who drink often. What actions can I take to prevent or manage alcohol abuse and dependence?  Do not drink alcohol if: ? Your health care provider tells you not to drink. ? You are pregnant, may be pregnant, or are planning to become pregnant.  If you drink alcohol: ? Limit how  much you use to:  0-1 drink a day for women.  0-2 drinks a day for men. ? Be aware of how much alcohol is in your drink. In the [place] one drink equals one 12 oz bottle of beer (355 mL), one 5 oz glass of wine (148 mL), or one 1 oz glass of hard liquor (44 mL).  Stop drinking if you have been drinking too much. This can be very hard to do if you are used to abusing alcohol. If  you begin to have withdrawal symptoms, talk with your health care provider or a person that you trust. These symptoms may include anxiety, shaky hands, headache, nausea, sweating, or not being able to sleep.  Choose to drink nonalcoholic beverages in social gatherings and places where there may be alcohol. Activity  Spend more time on activities that you enjoy that do not involve alcohol, like hobbies or exercise.  Find healthy ways to cope with stress, such as exercise, meditation, or spending time with people you care about. General information  Talk to your family, coworkers, and friends about supporting you in your efforts to stop drinking. If they drink, ask them not to drink around you. Spend more time with people who do not drink alcohol.  If you think that you have an alcohol dependency problem: ? Tell friends or family about your concerns. ? Talk with your health care provider or another health professional about where to get help. ? Work with a Transport planner and a Regulatory affairs officer. ? Consider joining a support group for people who struggle with alcohol abuse and dependence. Where to find support   Your health care provider.  SMART Recovery: www.smartrecovery.org Therapy and support groups  Local treatment centers or chemical dependency counselors.  Local AA groups in your community: NicTax.com.pt Where to find more information  Centers for Disease Control and Prevention: http://www.wolf.info/  National Institute on Alcohol Abuse and Alcoholism: http://www.bradshaw.com/  Alcoholics Anonymous (AA): NicTax.com.pt Contact a health care provider if:  You drank more or for longer than you intended on more than one occasion.  You tried to stop drinking or to cut back on how much you drink, but you were not able to.  You often drink to the point of vomiting or passing out.  You want to drink so badly that you cannot think about anything else.  You have problems in your life due to  drinking, but you continue to drink.  You keep drinking even though you feel anxious, depressed, or have experienced memory loss.  You have stopped doing the things you used to enjoy in order to drink.  You have to drink more than you used to in order to get the effect you want.  You experience anxiety, sweating, nausea, shakiness, and trouble sleeping when you try to stop drinking. Get help right away if:  You have thoughts about hurting yourself or others.  You have serious withdrawal symptoms, including: ? Confusion. ? Racing heart. ? High blood pressure. ? Fever. If you ever feel like you may hurt yourself or others, or have thoughts about taking your own life, get help right away. You can go to your nearest emergency department or call:  Your local emergency services (911 in the [place]  A suicide crisis helpline, such as the [place] at 734-621-1040. This is open 24 hours a day. Summary  Alcohol abuse and dependence can have a negative effect on your life. Drinking too much or too often can  lead to addiction.  If you drink alcohol, limit how much you use.  If you are having trouble keeping your drinking under control, find ways to change your behavior. Hobbies, calming activities, exercise, or support groups can help.  If you feel you need help with changing your drinking habits, talk with your health care provider, a good friend, or a therapist, or go to an [place] group. This information is not intended to replace advice given to you by your health care provider. Make sure you discuss any questions you have with your health care provider. Document Revised: 09/24/2018 Document Reviewed: 08/13/2018 Elsevier Patient Education  [place].  Tobacco Use Disorder Tobacco use disorder (TUD) occurs when a person craves, seeks, and uses tobacco, regardless of the consequences. This disorder can cause problems with mental and physical health. It can  affect your ability to have healthy relationships, and it can keep you from meeting your responsibilities at work, home, or school. Tobacco may be:  Smoked as a cigarette or cigar.  Inhaled using e-cigarettes.  Smoked in a pipe or hookah.  Chewed as smokeless tobacco.  Inhaled into the nostrils as snuff. Tobacco products contain a dangerous chemical called nicotine, which is very addictive. Nicotine triggers hormones that make the body feel stimulated and works on areas of the brain that make you feel good. These effects can make it hard for people to quit nicotine. Tobacco contains many other unsafe chemicals that can damage almost every organ in the body. Smoking tobacco also puts others in danger due to fire risk and possible health problems caused by breathing in secondhand smoke. What are the signs or symptoms? Symptoms of TUD may include:  Being unable to slow down or stop your tobacco use.  Spending an abnormal amount of time getting or using tobacco.  Craving tobacco. Cravings may last for up to 6 months after quitting.  Tobacco use that: ? Interferes with your work, school, or home life. ? Interferes with your personal and social relationships. ? Makes you give up activities that you once enjoyed or found important.  Using tobacco even though you know that it is: ? Dangerous or bad for your health or someone else's health. ? Causing problems in your life.  Needing more and more of the substance to get the same effect (developing tolerance).  Experiencing unpleasant symptoms if you do not use the substance (withdrawal). Withdrawal symptoms may include: ? Depressed, anxious, or irritable mood. ? Difficulty concentrating. ? Increased appetite. ? Restlessness or trouble sleeping.  Using the substance to avoid withdrawal. How is this diagnosed? This condition may be diagnosed based on:  Your current and past tobacco use. Your health care provider may ask questions  about how your tobacco use affects your life.  A physical exam. You may be diagnosed with TUD if you have at least two symptoms within a 49-monthperiod. How is this treated? This condition is treated by stopping tobacco use. Many people are unable to quit on their own and need help. Treatment may include:  Nicotine replacement therapy (NRT). NRT provides nicotine without the other harmful chemicals in tobacco. NRT gradually lowers the dosage of nicotine in the body and reduces withdrawal symptoms. NRT is available as: ? Over-the-counter gums, lozenges, and skin patches. ? Prescription mouth inhalers and nasal sprays.  Medicine that acts on the brain to reduce cravings and withdrawal symptoms.  A type of talk therapy that examines your triggers for tobacco use, how to avoid them,  and how to cope with cravings (behavioral therapy).  Hypnosis. This may help with withdrawal symptoms.  Joining a support group for others coping with TUD. The best treatment for TUD is usually a combination of medicine, talk therapy, and support groups. Recovery can be a long process. Many people start using tobacco again after stopping (relapse). If you relapse, it does not mean that treatment will not work. Follow these instructions at home:  Lifestyle  Do not use any products that contain nicotine or tobacco, such as cigarettes and e-cigarettes.  Avoid things that trigger tobacco use as much as you can. Triggers include people and situations that usually cause you to use tobacco.  Avoid drinks that contain caffeine, including coffee. These may worsen some withdrawal symptoms.  Find ways to manage stress. Wanting to smoke may cause stress, and stress can make you want to smoke. Relaxation techniques such as deep breathing, meditation, and yoga may help.  Attend support groups as needed. These groups are an important part of long-term recovery for many people. General instructions  Take over-the-counter  and prescription medicines only as told by your health care provider.  Check with your health care provider before taking any new prescription or over-the-counter medicines.  Decide on a friend, family member, or smoking quit-line (such as 1-800-QUIT-NOW in the [place] that you can call or text when you feel the urge to smoke or when you need help coping with cravings.  Keep all follow-up visits as told by your health care provider and therapist. This is important. Contact a health care provider if:  You are not able to take your medicines as prescribed.  Your symptoms get worse, even with treatment. Summary  Tobacco use disorder (TUD) occurs when a person craves, seeks, and uses tobacco regardless of the consequences.  This condition may be diagnosed based on your current and past tobacco use and a physical exam.  Many people are unable to quit on their own and need help. Recovery can be a long process.  The most effective treatment for TUD is usually a combination of medicine, talk therapy, and support groups. This information is not intended to replace advice given to you by your health care provider. Make sure you discuss any questions you have with your health care provider. Document Revised: 05/23/2017 Document Reviewed: 05/23/2017 Elsevier Patient Education  2020 [employer].

## 2020-06-09 NOTE — Progress Notes (Signed)
Subjective:    Patient ID: Kevin Duncan, male    DOB: [birth year], 49 y.o.   MRN: [id number]  49 y.o.M here to est PCP  Former [name] patient CAF, HTN, T2DM  Tobacco, ETOH use. With neuropathy on duloxitene  06/09/2020 Last OV was telehealth OV 01/07/20 with NP [name] The patient comes in today to officially establish for primary care.  At that last visit the patient complained of numbness and tingling the feet and the patient was on the gabapentin 600 mg 3 times daily at that visit duloxetine was added at 30 mg daily and trazodone given for sleep.  Patient had his diltiazem refilled at 2 tablets twice daily 120 mg tablets  Patient has history of longstanding chronic atrial fibrillation rate controlled.  He was to have a DC cardioversion but not pursue this because of insurance.  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  The patient's not on vitamin supplementation other than B12.  He cannot take folic acid due to side effects.  Patient is still smoking a pack a day of cigarettes.  Note on arrival blood pressure is 151/92.  Patient's BMI remains elevated at 33.  Patient denies any edema in the lower extremities or chest pain.  The patient is yet to have received a Covid vaccine and refuses a flu vaccine at this visit.  He does not have any specific reasons why he is not gotten the Covid vaccine.  The patient does have type 2 diabetes and the last hemoglobin A1c was 6.1 in July the patient is diet controlled only and not on medications  Past Medical History:  Diagnosis Date  . Atrial fibrillation ([place])   . Hypertension   . Prediabetes      Family History  Problem Relation Age of Onset  . Diabetes Mother   . Heart disease Father   . Heart disease Brother   . Diabetes Maternal Grandmother   . Heart disease Paternal Grandmother      Social History   Socioeconomic History  . Marital status: Single     Spouse name: Not on file  . Number of children: Not on file  . Years of education: Not on file  . Highest education level: Not on file  Occupational History  . Not on file  Tobacco Use  . Smoking status: Current Every Day Smoker    Packs/day: 1.50    Types: Cigarettes  . Smokeless tobacco: Never Used  . Tobacco comment: 3/4 pack daily  Substance and Sexual Activity  . Alcohol use: Not Currently    Alcohol/week: 3.0 standard drinks    Types: 3 Standard drinks or equivalent per week    Comment: 5-6 beer daily  . Drug use: No  . Sexual activity: Not on file  Other Topics Concern  . Not on file  Social History Narrative  . Not on file   Social Determinants of Health   Financial Resource Strain: Not on file  Food Insecurity: Not on file  Transportation Needs: Not on file  Physical Activity: Not on file  Stress: Not on file  Social Connections: Not on file  Intimate Partner Violence: Not on file     Allergies  Allergen Reactions  . Folic Acid Swelling    redness  . Hydrocodone Hives     Outpatient Medications Prior to Visit  Medication Sig Dispense Refill  . albuterol (VENTOLIN  HFA) 108 (90 Base) MCG/ACT inhaler Inhale 2 puffs into the lungs every 6 (six) hours as needed for wheezing or shortness of breath. 18 g 1  . Blood Glucose Monitoring Suppl (TRUE METRIX METER) w/Device KIT 1 kit by Does not apply route daily. Use to check fasting blood sugar daily. 1 kit 0  . diltiazem (DILACOR XR) 120 MG 24 hr capsule Take 2 capsules (240 mg total) by mouth 2 (two) times daily. 360 capsule 0  . furosemide (LASIX) 20 MG tablet TAKE ONE TABLET DAILY BY MOUTH FOR 3 DAYS THEN AS NEEDED FOR LEG SWELLING 30 tablet 3  . gabapentin (NEURONTIN) 300 MG capsule TAKE 2 CAPSULES (600 MG TOTAL) BY MOUTH 3 (THREE) TIMES DAILY. 180 capsule 0  . glucose blood test strip Use to check fasting blood sugar daily. 100 each 2  . TRUEplus Lancets 28G MISC Use to check fasting blood sugar daily. 100 each 2   . vitamin B-12 (CYANOCOBALAMIN) 1000 MCG tablet Take 1,000 mcg by mouth daily.     . Aspirin-Acetaminophen-Caffeine (GOODY HEADACHE PO) Take 2 Packages by mouth daily.     . DULoxetine (CYMBALTA) 30 MG capsule TAKE 1 CAPSULE (30 MG TOTAL) BY MOUTH DAILY. 30 capsule 0  . rivaroxaban (XARELTO) 20 MG TABS tablet Take 1 tablet (20 mg total) by mouth daily with supper. 30 tablet 11  . traZODone (DESYREL) 50 MG tablet Take 1 tablet (50 mg total) by mouth at bedtime as needed for sleep. 90 tablet 3  . naltrexone (DEPADE) 50 MG tablet Take 1/2 daily for 1 week then 1 daily.  Do not drink alcohol (Patient not taking: No sig reported) 30 tablet 3  . Potassium 99 MG TABS Take by mouth. (Patient not taking: Reported on 06/09/2020)     No facility-administered medications prior to visit.      Review of Systems Constitutional:   No  weight loss, night sweats,  Fevers, chills, fatigue, lassitude. HEENT:   No headaches,  Difficulty swallowing,  Tooth/dental problems,  Sore throat,                No sneezing, itching, ear ache, nasal congestion, post nasal drip,   CV:  No chest pain,  Orthopnea, PND, swelling in lower extremities, anasarca, dizziness, palpitations  GI  No heartburn, indigestion, abdominal pain, nausea, vomiting, diarrhea, change in bowel habits, loss of appetite  Resp: No shortness of breath with exertion or at rest.  No excess mucus, no productive cough,  No non-productive cough,  No coughing up of blood.  No change in color of mucus.  No wheezing.  No chest wall deformity  Skin: no rash or lesions.  GU: no dysuria, change in color of urine, no urgency or frequency.  No flank pain.  MS:  No joint pain or swelling.  No decreased range of motion.  No back pain.  Psych:  No change in mood or affect. No depression or anxiety.  No memory loss.     Objective:   Physical Exam Vitals:   06/09/20 0854  BP: (!) 151/92  Pulse: 81  SpO2: 97%  Weight: 243 lb (110.2 kg)  Height: 6'  (1.829 m)    Gen: Pleasant, well-nourished, in no distress,  normal affect  ENT: No lesions,  mouth clear,  oropharynx clear, no postnasal drip.  Poor dentition  Neck: No JVD, no TMG, no carotid bruits  Lungs: No use of accessory muscles, no dullness to percussion, clear without rales or rhonchi  Cardiovascular: Irregularly irregular rhythm, heart sounds normal, no murmur or gallops, no peripheral edema  Abdomen: soft and NT, no HSM,  BS normal  Musculoskeletal: No deformities, no cyanosis or clubbing  Neuro: alert, non focal  Skin: Warm, no lesions or rashes  Foot exam normal      Assessment & Plan:  I personally reviewed all images and lab data in the [facility] system as well as any outside material available during this office visit and agree with the  radiology impressions.   Atrial fibrillation (HCC) Chronic atrial fibrillation which the patient persists on exam today  Continue diltiazem 240 mg twice daily  Obtain metabolic profile blood counts  Referral to cardiology will be made for follow-up  Smoking cessation and alcohol cessation urged  We will connect with our clinical social worker on the alcohol use  Essential hypertension Hypertension poorly controlled  Begin valsartan hand 160 mg daily  Alcohol abuse Ongoing alcohol abuse  Patient connected with our clinical social worker today  Begin thiamine 100 mg daily  Cigarette smoker    . Current smoking consumption amount: Pack a day  . Dicsussion on advise to quit smoking and smoking impacts: Cardiovascular lung health impacts  . Patient's willingness to quit: Interested in quitting  . Methods to quit smoking discussed: Nicotine replacement behavioral modification  . Medication management of smoking session drugs discussed: Nicotine lozenges  . Resources provided:  AVS   . Setting quit date not established  . Follow-up arranged 6 weeks   Time spent counseling the patient: 5  minutes   Chronic anticoagulation On Xarelto due to chronic anticoagulation due to the chronic atrial fibrillation  Check CBC continue Xarelto   Daneil was seen today for hypertension.  Diagnoses and all orders for this visit:  Colon cancer screening -     Fecal occult blood, imunochemical  Longstanding persistent atrial fibrillation (HCC) -     Comprehensive metabolic panel -     CBC with Differential/Platelet -     DULoxetine (CYMBALTA) 30 MG capsule; Take 1 capsule (30 mg total) by mouth daily. -     rivaroxaban (XARELTO) 20 MG TABS tablet; Take 1 tablet (20 mg total) by mouth daily with supper. 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  Need for hepatitis C screening test -     HCV Ab w Reflex to Quant PCR  Essential hypertension  Alcohol abuse  Cigarette smoker  Chronic anticoagulation  Other orders -     thiamine 100 MG tablet; Take 1 tablet (100 mg total) by mouth daily. -     Discontinue: nicotine polacrilex (NICORETTE MINI) 4 MG lozenge; Take three times a day to quit alcohol -     valsartan (DIOVAN) 160 MG tablet; Take 1 tablet (160 mg total) by mouth daily. -     Discontinue: nicotine polacrilex (NICORETTE MINI) 4 MG lozenge; Take three times a day to quit alcohol -     nicotine polacrilex (NICORETTE MINI) 4 MG lozenge; Take three times a day to quit smoking   I spent 10 minutes counseling this patient towards obtaining a Covid vaccine he initially was very resistant at the end of the discussion I believe he is interested he may pursue I gave him information we could obtain the vaccine.  Hepatitis C study will be obtained  Fecal occult study will  be obtained for colon cancer screening

## 2020-06-09 NOTE — Assessment & Plan Note (Signed)
Begin thiamine and continue gabapentin

## 2020-06-09 NOTE — Assessment & Plan Note (Addendum)
Ongoing alcohol abuse  Patient connected with our clinical social worker today  Begin thiamine 100 mg daily

## 2020-06-09 NOTE — Assessment & Plan Note (Signed)
Hypertension poorly controlled  Begin valsartan hand 160 mg daily

## 2020-06-09 NOTE — Assessment & Plan Note (Signed)
On Xarelto due to chronic anticoagulation due to the chronic atrial fibrillation  Check CBC continue Xarelto

## 2020-06-09 NOTE — Progress Notes (Signed)
Pt has dental abscess. Having tingling in feet.

## 2020-06-09 NOTE — Assessment & Plan Note (Signed)
Chronic atrial fibrillation which the patient persists on exam today  Continue diltiazem 240 mg twice daily  Obtain metabolic profile blood counts  Referral to cardiology will be made for follow-up  Smoking cessation and alcohol cessation urged  We will connect with our clinical social worker on the alcohol use

## 2020-06-10 ENCOUNTER — Telehealth: Payer: Self-pay

## 2020-06-10 LAB — COMPREHENSIVE METABOLIC PANEL
ALT: 30 IU/L (ref 0–44)
AST: 38 IU/L (ref 0–40)
Albumin/Globulin Ratio: 1.2 (ref 1.2–2.2)
Albumin: 4.1 g/dL (ref 4.0–5.0)
Alkaline Phosphatase: 124 IU/L — ABNORMAL HIGH (ref 44–121)
BUN/Creatinine Ratio: 7 — ABNORMAL LOW (ref 9–20)
BUN: 8 mg/dL (ref 6–24)
Bilirubin Total: 0.5 mg/dL (ref 0.0–1.2)
CO2: 28 mmol/L (ref 20–29)
Calcium: 9.1 mg/dL (ref 8.7–10.2)
Chloride: 96 mmol/L (ref 96–106)
Creatinine, Ser: 1.08 mg/dL (ref 0.76–1.27)
GFR calc Af Amer: 92 mL/min/{1.73_m2} (ref >59)
GFR calc non Af Amer: 80 mL/min/{1.73_m2} (ref >59)
Globulin, Total: 3.5 g/dL (ref 1.5–4.5)
Glucose: 153 mg/dL — ABNORMAL HIGH (ref 65–99)
Potassium: 3.3 mmol/L — ABNORMAL LOW (ref 3.5–5.2)
Sodium: 142 mmol/L (ref 134–144)
Total Protein: 7.6 g/dL (ref 6.0–8.5)

## 2020-06-10 LAB — CBC WITH DIFFERENTIAL/PLATELET
Basophils Absolute: 0.1 10*3/uL (ref 0.0–0.2)
Basos: 1 %
EOS (ABSOLUTE): 0.1 10*3/uL (ref 0.0–0.4)
Eos: 2 %
Hematocrit: 49.8 % (ref 37.5–51.0)
Hemoglobin: 17.6 g/dL (ref 13.0–17.7)
Immature Grans (Abs): 0 10*3/uL (ref 0.0–0.1)
Immature Granulocytes: 0 %
Lymphocytes Absolute: 1.9 10*3/uL (ref 0.7–3.1)
Lymphs: 22 %
MCH: 34 pg — ABNORMAL HIGH (ref 26.6–33.0)
MCHC: 35.3 g/dL (ref 31.5–35.7)
MCV: 96 fL (ref 79–97)
Monocytes Absolute: 0.9 10*3/uL (ref 0.1–0.9)
Monocytes: 10 %
Neutrophils Absolute: 5.7 10*3/uL (ref 1.4–7.0)
Neutrophils: 65 %
Platelets: 245 10*3/uL (ref 150–450)
RBC: 5.17 x10E6/uL (ref 4.14–5.80)
RDW: 11.8 % (ref 11.6–15.4)
WBC: 8.8 10*3/uL (ref 3.4–10.8)

## 2020-06-10 LAB — HCV AB W REFLEX TO QUANT PCR: HCV Ab: <0.1 s/co ratio (ref 0.0–0.9)

## 2020-06-10 LAB — HCV INTERPRETATION

## 2020-06-10 NOTE — Telephone Encounter (Signed)
-----   Message from Storm Frisk, MD sent at 06/10/2020  7:01 AM EST ----- Let pt know liver and kidney function normal  hep C neg

## 2020-06-10 NOTE — Telephone Encounter (Signed)
Patient name and DOB has been verified Patient was informed of lab results. Patient had no questions.  

## 2020-06-24 LAB — FECAL OCCULT BLOOD, IMMUNOCHEMICAL: Fecal Occult Bld: NEGATIVE

## 2020-06-25 ENCOUNTER — Telehealth (INDEPENDENT_AMBULATORY_CARE_PROVIDER_SITE_OTHER): Payer: Self-pay

## 2020-06-25 NOTE — Telephone Encounter (Signed)
-----   Message from Storm Frisk, MD sent at 06/25/2020  1:27 PM EST ----- Let pt know fecal occult blood NEG   no colon cancer, recheck in one year

## 2020-06-25 NOTE — Telephone Encounter (Signed)
Patient verified date of birth. He is aware that fecal ocuclt blood test was negative; no colon cancer, retest in one year. He verbalized understanding. Maryjean Morn, CMA

## 2020-07-05 ENCOUNTER — Other Ambulatory Visit: Payer: Self-pay | Admitting: Critical Care Medicine

## 2020-07-05 DIAGNOSIS — R200 Anesthesia of skin: Secondary | ICD-10-CM

## 2020-07-05 MED FILL — ?DULoxetine HCL 30MG CPEP: 30 | 30 days supply | Qty: 30 | Fill #0

## 2020-07-05 MED FILL — GABAPENTIN 300 MG CAPSULE: 300 | 30 days supply | Qty: 180 | Fill #0

## 2020-07-05 MED FILL — XARELTO 20 MG TABLET: 20 | 30 days supply | Qty: 30 | Fill #8

## 2020-07-05 MED FILL — DILTIAZEM 24HR ER 120 MG CA: 120 | 30 days supply | Qty: 120 | Fill #2

## 2020-07-05 MED FILL — ?VALSARTAN 160MG TABLET: 160 | 30 days supply | Qty: 30 | Fill #1

## 2020-07-05 NOTE — Telephone Encounter (Signed)
Requested Prescriptions  Pending Prescriptions Disp Refills  . gabapentin (NEURONTIN) 300 MG capsule [Pharmacy Med Name: GABAPENTIN 300 MG CAPSULE 300 Capsule] 180 capsule 0    Sig: TAKE 2 CAPSULES (600 MG TOTAL) BY MOUTH 3 (THREE) TIMES DAILY.     Neurology: Anticonvulsants - gabapentin Passed - 07/05/2020 11:07 AM      Passed - Valid encounter within last 12 months    Recent Outpatient Visits          3 weeks ago Longstanding persistent atrial fibrillation [facility])   [facility] Storm Frisk, MD   3 months ago Alcohol-induced polyneuropathy [facility])   [place] [facility] Claiborne Rigg, NP   6 months ago Hyperglycemia   [facility] [place], [place], [place]   8 months ago Longstanding persistent atrial fibrillation [facility])   [place] [name], [place], MD   9 months ago Prediabetes   [facility] [place], Cornelius Moras, RPH-CPP

## 2020-08-04 ENCOUNTER — Other Ambulatory Visit: Payer: Self-pay | Admitting: Critical Care Medicine

## 2020-08-04 ENCOUNTER — Other Ambulatory Visit: Payer: Self-pay | Admitting: Family Medicine

## 2020-08-04 DIAGNOSIS — R609 Edema, unspecified: Secondary | ICD-10-CM

## 2020-08-04 DIAGNOSIS — R200 Anesthesia of skin: Secondary | ICD-10-CM

## 2020-08-04 MED FILL — GABAPENTIN 300 MG CAPSULE: 300 | 30 days supply | Qty: 180 | Fill #0

## 2020-08-04 MED FILL — XARELTO 20 MG TABLET: 20 | 30 days supply | Qty: 30 | Fill #9

## 2020-08-04 MED FILL — ?DULoxetine HCL 30MG CPEP: 30 | 30 days supply | Qty: 30 | Fill #1

## 2020-08-04 MED FILL — FUROSEMIDE 20 MG TABS: 20 | 30 days supply | Qty: 30 | Fill #0

## 2020-08-04 MED FILL — DILTIAZEM 24HR ER 120 MG CA: 120 | 30 days supply | Qty: 120 | Fill #0

## 2020-08-04 MED FILL — ?VALSARTAN 160MG TABLET: 160 | 30 days supply | Qty: 30 | Fill #2

## 2020-08-04 NOTE — Telephone Encounter (Signed)
Requested Prescriptions  Pending Prescriptions Disp Refills  . furosemide (LASIX) 20 MG tablet [Pharmacy Med Name: FUROSEMIDE 20 MG TABS 20 Tablet] 30 tablet 3    Sig: TAKE ONE TABLET DAILY BY MOUTH FOR 3 DAYS THEN AS NEEDED FOR LEG SWELLING     Cardiovascular:  Diuretics - Loop Failed - 08/04/2020  2:10 PM      Failed - K in normal range and within 360 days    Potassium  Date Value Ref Range Status  06/09/2020 3.3 (L) 3.5 - 5.2 mmol/L Final         Failed - Last BP in normal range    BP Readings from Last 1 Encounters:  06/09/20 (!) 151/92         Passed - Ca in normal range and within 360 days    Calcium  Date Value Ref Range Status  06/09/2020 9.1 8.7 - 10.2 mg/dL Final         Passed - Na in normal range and within 360 days    Sodium  Date Value Ref Range Status  06/09/2020 142 134 - 144 mmol/L Final         Passed - Cr in normal range and within 360 days    Creatinine, Ser  Date Value Ref Range Status  06/09/2020 1.08 0.76 - 1.27 mg/dL Final         Passed - Valid encounter within last 6 months    Recent Outpatient Visits          1 month ago Longstanding persistent atrial fibrillation [facility])   [place] [facility] Storm Frisk, MD   4 months ago Alcohol-induced polyneuropathy [facility])   [place] [facility] Claiborne Rigg, NP   7 months ago Hyperglycemia   [facility] [place], [place], [place]   9 months ago Longstanding persistent atrial fibrillation [facility])   [place] [name], [place], MD   10 months ago Prediabetes   [facility] [place], Cornelius Moras, RPH-CPP

## 2020-08-31 ENCOUNTER — Other Ambulatory Visit: Payer: Self-pay

## 2020-08-31 ENCOUNTER — Inpatient Hospital Stay (HOSPITAL_BASED_OUTPATIENT_CLINIC_OR_DEPARTMENT_OTHER)
Admission: EM | Admit: 2020-08-31 | Discharge: 2020-09-04 | DRG: 190 | Disposition: A | Discharge status: Home / Self Care | Payer: Self-pay | Attending: Internal Medicine | Admitting: Internal Medicine

## 2020-08-31 ENCOUNTER — Emergency Department (HOSPITAL_BASED_OUTPATIENT_CLINIC_OR_DEPARTMENT_OTHER): Payer: Self-pay

## 2020-08-31 ENCOUNTER — Encounter (HOSPITAL_BASED_OUTPATIENT_CLINIC_OR_DEPARTMENT_OTHER): Payer: Self-pay | Admitting: Emergency Medicine

## 2020-08-31 DIAGNOSIS — R7303 Prediabetes: Secondary | ICD-10-CM | POA: Diagnosis present

## 2020-08-31 DIAGNOSIS — Z889 Allergy status to unspecified drugs, medicaments and biological substances status: Secondary | ICD-10-CM

## 2020-08-31 DIAGNOSIS — J9602 Acute respiratory failure with hypercapnia: Secondary | ICD-10-CM | POA: Diagnosis present

## 2020-08-31 DIAGNOSIS — R062 Wheezing: Secondary | ICD-10-CM

## 2020-08-31 DIAGNOSIS — Z981 Arthrodesis status: Secondary | ICD-10-CM

## 2020-08-31 DIAGNOSIS — I482 Chronic atrial fibrillation, unspecified: Secondary | ICD-10-CM | POA: Diagnosis present

## 2020-08-31 DIAGNOSIS — Z833 Family history of diabetes mellitus: Secondary | ICD-10-CM

## 2020-08-31 DIAGNOSIS — J441 Chronic obstructive pulmonary disease with (acute) exacerbation: Principal | ICD-10-CM | POA: Diagnosis present

## 2020-08-31 DIAGNOSIS — I10 Essential (primary) hypertension: Secondary | ICD-10-CM | POA: Diagnosis present

## 2020-08-31 DIAGNOSIS — Z7901 Long term (current) use of anticoagulants: Secondary | ICD-10-CM

## 2020-08-31 DIAGNOSIS — Z885 Allergy status to narcotic agent status: Secondary | ICD-10-CM

## 2020-08-31 DIAGNOSIS — F17210 Nicotine dependence, cigarettes, uncomplicated: Secondary | ICD-10-CM | POA: Diagnosis present

## 2020-08-31 DIAGNOSIS — G621 Alcoholic polyneuropathy: Secondary | ICD-10-CM | POA: Diagnosis present

## 2020-08-31 DIAGNOSIS — I4891 Unspecified atrial fibrillation: Secondary | ICD-10-CM | POA: Diagnosis present

## 2020-08-31 DIAGNOSIS — Z79899 Other long term (current) drug therapy: Secondary | ICD-10-CM

## 2020-08-31 DIAGNOSIS — Z8249 Family history of ischemic heart disease and other diseases of the circulatory system: Secondary | ICD-10-CM

## 2020-08-31 DIAGNOSIS — Z6832 Body mass index (BMI) 32.0-32.9, adult: Secondary | ICD-10-CM

## 2020-08-31 DIAGNOSIS — J9601 Acute respiratory failure with hypoxia: Secondary | ICD-10-CM | POA: Diagnosis present

## 2020-08-31 DIAGNOSIS — Z20822 Contact with and (suspected) exposure to covid-19: Secondary | ICD-10-CM | POA: Diagnosis present

## 2020-08-31 DIAGNOSIS — F1010 Alcohol abuse, uncomplicated: Secondary | ICD-10-CM | POA: Diagnosis present

## 2020-08-31 HISTORY — DX: Chronic obstructive pulmonary disease, unspecified: J44.9

## 2020-08-31 LAB — CBC WITH DIFFERENTIAL/PLATELET
Abs Immature Granulocytes: 0.09 10*3/uL — ABNORMAL HIGH (ref 0.00–0.07)
Basophils Absolute: 0.1 10*3/uL (ref 0.0–0.1)
Basophils Relative: 1 %
Eosinophils Absolute: 0 10*3/uL (ref 0.0–0.5)
Eosinophils Relative: 0 %
HCT: 42 % (ref 39.0–52.0)
Hemoglobin: 14.3 g/dL (ref 13.0–17.0)
Immature Granulocytes: 1 %
Lymphocytes Relative: 10 %
Lymphs Abs: 1.5 10*3/uL (ref 0.7–4.0)
MCH: 34.6 pg — ABNORMAL HIGH (ref 26.0–34.0)
MCHC: 34 g/dL (ref 30.0–36.0)
MCV: 101.7 fL — ABNORMAL HIGH (ref 80.0–100.0)
Monocytes Absolute: 1.8 10*3/uL — ABNORMAL HIGH (ref 0.1–1.0)
Monocytes Relative: 12 %
Neutro Abs: 11.2 10*3/uL — ABNORMAL HIGH (ref 1.7–7.7)
Neutrophils Relative %: 76 %
Platelets: 233 10*3/uL (ref 150–400)
RBC: 4.13 MIL/uL — ABNORMAL LOW (ref 4.22–5.81)
RDW: 12.8 % (ref 11.5–15.5)
WBC: 14.7 10*3/uL — ABNORMAL HIGH (ref 4.0–10.5)
nRBC: 0 % (ref 0.0–0.2)

## 2020-08-31 LAB — COMPREHENSIVE METABOLIC PANEL
ALT: 24 U/L (ref 0–44)
AST: 33 U/L (ref 15–41)
Albumin: 3.3 g/dL — ABNORMAL LOW (ref 3.5–5.0)
Alkaline Phosphatase: 93 U/L (ref 38–126)
Anion gap: 12 (ref 5–15)
BUN: 13 mg/dL (ref 6–20)
CO2: 33 mmol/L — ABNORMAL HIGH (ref 22–32)
Calcium: 8.6 mg/dL — ABNORMAL LOW (ref 8.9–10.3)
Chloride: 91 mmol/L — ABNORMAL LOW (ref 98–111)
Creatinine, Ser: 0.99 mg/dL (ref 0.61–1.24)
GFR, Estimated: >60 mL/min (ref >60)
Glucose, Bld: 162 mg/dL — ABNORMAL HIGH (ref 70–99)
Potassium: 3.1 mmol/L — ABNORMAL LOW (ref 3.5–5.1)
Sodium: 136 mmol/L (ref 135–145)
Total Bilirubin: 0.9 mg/dL (ref 0.3–1.2)
Total Protein: 7.7 g/dL (ref 6.5–8.1)

## 2020-08-31 LAB — I-STAT ARTERIAL BLOOD GAS, ED
Acid-Base Excess: 11 mmol/L — ABNORMAL HIGH (ref 0.0–2.0)
Bicarbonate: 36.8 mmol/L — ABNORMAL HIGH (ref 20.0–28.0)
Calcium, Ion: 1.16 mmol/L (ref 1.15–1.40)
HCT: 42 % (ref 39.0–52.0)
Hemoglobin: 14.3 g/dL (ref 13.0–17.0)
O2 Saturation: 90 %
Potassium: 3.3 mmol/L — ABNORMAL LOW (ref 3.5–5.1)
Sodium: 137 mmol/L (ref 135–145)
TCO2: 38 mmol/L — ABNORMAL HIGH (ref 22–32)
pCO2 arterial: 50.8 mmHg — ABNORMAL HIGH (ref 32.0–48.0)
pH, Arterial: 7.468 — ABNORMAL HIGH (ref 7.350–7.450)
pO2, Arterial: 56 mmHg — ABNORMAL LOW (ref 83.0–108.0)

## 2020-08-31 LAB — TROPONIN I (HIGH SENSITIVITY)
Troponin I (High Sensitivity): 7 ng/L (ref <18)
Troponin I (High Sensitivity): 4 ng/L (ref <18)

## 2020-08-31 LAB — BRAIN NATRIURETIC PEPTIDE: B Natriuretic Peptide: 232.2 pg/mL — ABNORMAL HIGH (ref 0.0–100.0)

## 2020-08-31 LAB — RESP PANEL BY RT-PCR (FLU A&B, COVID) ARPGX2
Influenza A by PCR: NEGATIVE
Influenza B by PCR: NEGATIVE
SARS Coronavirus 2 by RT PCR: NEGATIVE

## 2020-08-31 MED ORDER — NICOTINE 21 MG/24HR TD PT24
21.0000 mg | MEDICATED_PATCH | Freq: Every day | TRANSDERMAL | Status: DC
  Filled 2020-08-31 (×4): qty 1

## 2020-08-31 MED ORDER — IPRATROPIUM-ALBUTEROL 0.5-2.5 (3) MG/3ML IN SOLN
3.0000 mL | Freq: Four times a day (QID) | RESPIRATORY_TRACT | Status: DC
  Administered 2020-08-31: 3 mL via RESPIRATORY_TRACT
  Filled 2020-08-31: qty 3

## 2020-08-31 MED ORDER — POTASSIUM CHLORIDE CRYS ER 20 MEQ PO TBCR
40.0000 meq | EXTENDED_RELEASE_TABLET | Freq: Once | ORAL | Status: AC
  Administered 2020-08-31: 40 meq via ORAL
  Filled 2020-08-31: qty 2

## 2020-08-31 MED ORDER — VITAMIN B-12 1000 MCG PO TABS
1000.0000 ug | ORAL_TABLET | Freq: Every day | ORAL | Status: DC
  Administered 2020-09-01 – 2020-09-04 (×4): 1000 ug via ORAL
  Filled 2020-08-31 (×4): qty 1

## 2020-08-31 MED ORDER — IRBESARTAN 150 MG PO TABS
150.0000 mg | ORAL_TABLET | Freq: Every day | ORAL | Status: DC
  Administered 2020-09-01 – 2020-09-04 (×4): 150 mg via ORAL
  Filled 2020-08-31 (×4): qty 1

## 2020-08-31 MED ORDER — ALBUTEROL SULFATE (2.5 MG/3ML) 0.083% IN NEBU
2.5000 mg | INHALATION_SOLUTION | Freq: Once | RESPIRATORY_TRACT | Status: AC
  Administered 2020-08-31: 2.5 mg via RESPIRATORY_TRACT
  Filled 2020-08-31: qty 3

## 2020-08-31 MED ORDER — THIAMINE HCL 100 MG PO TABS
100.0000 mg | ORAL_TABLET | Freq: Every day | ORAL | Status: DC
  Administered 2020-08-31 – 2020-09-04 (×5): 100 mg via ORAL
  Filled 2020-08-31 (×6): qty 1

## 2020-08-31 MED ORDER — FUROSEMIDE 20 MG PO TABS
20.0000 mg | ORAL_TABLET | Freq: Every day | ORAL | Status: DC
  Administered 2020-08-31 – 2020-09-01 (×2): 20 mg via ORAL
  Filled 2020-08-31 (×2): qty 1

## 2020-08-31 MED ORDER — SODIUM CHLORIDE 0.9 % IV SOLN
500.0000 mg | Freq: Once | INTRAVENOUS | Status: AC
  Administered 2020-08-31: 500 mg via INTRAVENOUS
  Filled 2020-08-31: qty 500

## 2020-08-31 MED ORDER — ALBUTEROL SULFATE HFA 108 (90 BASE) MCG/ACT IN AERS
8.0000 | INHALATION_SPRAY | Freq: Once | RESPIRATORY_TRACT | Status: AC
  Administered 2020-08-31: 8 via RESPIRATORY_TRACT
  Filled 2020-08-31: qty 6.7

## 2020-08-31 MED ORDER — GABAPENTIN 300 MG PO CAPS
600.0000 mg | ORAL_CAPSULE | Freq: Three times a day (TID) | ORAL | Status: DC
  Administered 2020-08-31 – 2020-09-04 (×11): 600 mg via ORAL
  Filled 2020-08-31 (×11): qty 2

## 2020-08-31 MED ORDER — METHYLPREDNISOLONE SODIUM SUCC 125 MG IJ SOLR
125.0000 mg | Freq: Once | INTRAMUSCULAR | Status: AC
  Administered 2020-08-31: 125 mg via INTRAVENOUS
  Filled 2020-08-31: qty 2

## 2020-08-31 MED ORDER — ALBUTEROL SULFATE HFA 108 (90 BASE) MCG/ACT IN AERS: 8.0000 | INHALATION_SPRAY | Freq: Once | RESPIRATORY_TRACT | Status: DC

## 2020-08-31 MED ORDER — IPRATROPIUM-ALBUTEROL 0.5-2.5 (3) MG/3ML IN SOLN
3.0000 mL | Freq: Once | RESPIRATORY_TRACT | Status: AC
  Administered 2020-08-31: 3 mL via RESPIRATORY_TRACT
  Filled 2020-08-31: qty 3

## 2020-08-31 MED ORDER — PREDNISONE 20 MG PO TABS: 40.0000 mg | ORAL_TABLET | Freq: Every day | ORAL | Status: DC

## 2020-08-31 MED ORDER — AZITHROMYCIN 250 MG PO TABS
500.0000 mg | ORAL_TABLET | Freq: Every day | ORAL | Status: DC
  Administered 2020-09-01 – 2020-09-04 (×4): 500 mg via ORAL
  Filled 2020-08-31 (×4): qty 2

## 2020-08-31 MED ORDER — ACETAMINOPHEN 650 MG RE SUPP: 650.0000 mg | Freq: Four times a day (QID) | RECTAL | Status: DC | PRN

## 2020-08-31 MED ORDER — ALBUTEROL SULFATE (2.5 MG/3ML) 0.083% IN NEBU: 2.5000 mg | INHALATION_SOLUTION | RESPIRATORY_TRACT | Status: DC | PRN

## 2020-08-31 MED ORDER — ACETAMINOPHEN 325 MG PO TABS: 650.0000 mg | ORAL_TABLET | Freq: Four times a day (QID) | ORAL | Status: DC | PRN

## 2020-08-31 MED ORDER — METHYLPREDNISOLONE SODIUM SUCC 40 MG IJ SOLR
40.0000 mg | Freq: Four times a day (QID) | INTRAMUSCULAR | Status: DC
  Administered 2020-08-31 – 2020-09-02 (×7): 40 mg via INTRAVENOUS
  Filled 2020-08-31 (×7): qty 1

## 2020-08-31 MED ORDER — RIVAROXABAN 20 MG PO TABS
20.0000 mg | ORAL_TABLET | Freq: Every day | ORAL | Status: DC
  Administered 2020-08-31 – 2020-09-03 (×4): 20 mg via ORAL
  Filled 2020-08-31 (×4): qty 1

## 2020-08-31 MED ORDER — ONDANSETRON HCL 4 MG PO TABS: 4.0000 mg | ORAL_TABLET | Freq: Four times a day (QID) | ORAL | Status: DC | PRN

## 2020-08-31 MED ORDER — DIPHENHYDRAMINE HCL 25 MG PO CAPS
25.0000 mg | ORAL_CAPSULE | Freq: Every evening | ORAL | Status: DC | PRN
  Administered 2020-09-02 – 2020-09-03 (×2): 25 mg via ORAL
  Filled 2020-08-31 (×2): qty 1

## 2020-08-31 MED ORDER — DULOXETINE HCL 30 MG PO CPEP
30.0000 mg | ORAL_CAPSULE | Freq: Every day | ORAL | Status: DC
  Administered 2020-08-31 – 2020-09-04 (×5): 30 mg via ORAL
  Filled 2020-08-31 (×5): qty 1

## 2020-08-31 MED ORDER — METOPROLOL TARTRATE 5 MG/5ML IV SOLN
5.0000 mg | Freq: Four times a day (QID) | INTRAVENOUS | Status: DC | PRN
  Administered 2020-09-02: 5 mg via INTRAVENOUS
  Filled 2020-08-31: qty 5

## 2020-08-31 MED ORDER — IPRATROPIUM-ALBUTEROL 0.5-2.5 (3) MG/3ML IN SOLN
3.0000 mL | Freq: Three times a day (TID) | RESPIRATORY_TRACT | Status: DC
  Administered 2020-09-01: 3 mL via RESPIRATORY_TRACT
  Filled 2020-08-31: qty 3

## 2020-08-31 MED ORDER — ALBUTEROL SULFATE (2.5 MG/3ML) 0.083% IN NEBU
5.0000 mg | INHALATION_SOLUTION | RESPIRATORY_TRACT | Status: DC | PRN
  Administered 2020-08-31: 5 mg via RESPIRATORY_TRACT
  Filled 2020-08-31: qty 6

## 2020-08-31 MED ORDER — ONDANSETRON HCL 4 MG/2ML IJ SOLN: 4.0000 mg | Freq: Four times a day (QID) | INTRAMUSCULAR | Status: DC | PRN

## 2020-08-31 NOTE — ED Notes (Signed)
PT Charlack fell to chin. O2 88%. Thomson placed back on pt and O2 rose to 92%

## 2020-08-31 NOTE — ED Notes (Signed)
Attempted to call report, RN unavailable.

## 2020-08-31 NOTE — H&P (Addendum)
History and Physical    Kevin Duncan [facility] ZCH:[id number] DOB: [birth year] DOA: 08/31/2020  PCP: Elsie Stain, MD   Patient coming from: [place]  Patient presents with  . Shortness of Breath     HPI: Kevin Duncan is a 50 y.o. male with medical history significant for A. fib on chronic Xarelto/diltiazem 120 mg, alcohol abuse, alcohol induced polyneuropathy-on Neurontin/Cymbalta, essential hypertension, COPD(?) on albuterol inhaler, hypertension on valsartan presented to [place] with shortness of breath x1 week along with cough w/ mild sputum without blood,dyspnea on exertion. He endorses weight gain about  20 lb in 3 to 4 months, on and off leg swelling and sob worse on laying flat and during night.  ED Course: Was tachypneic saturating 85% room air needing 4l [place] to maintain his pulse ox to 96%, exam with diffuse expiratory wheezing, EKG rate controlled A. Fib, lab with elevated BNP troponin negative chest x-ray cardiomegaly. abg- pco2 50, po2 56, ph 7,4, hypokalemia 3.3, leukocytosis 14.7K.  COVID-19 negative, patient was given nicotine, Zithromax, bronchodilators, Solu-Medrol 125 mg, and admission was requested for acute respiratory failure with hypoxia and COPD exacerbation On my eval he is aaox3, he reports he feels 80% better after nebs and steroid earlier in the ED. He is currently wheezing. He has had no chest pain.Patient otherwise denies any nausea, vomiting,fever, chills, headache, focal weakness, numbness tingling, speech difficulties  Vitals:   08/31/20 1409 08/31/20 1429 08/31/20 1500 08/31/20 1530  BP:  120/79 138/84 123/84  Pulse:  79 75 (!) 56  Resp:  20 16 (!) 21  Temp:      TempSrc:      SpO2: 92% 94% 90% 91%  Weight:      Height:        Review of Systems: All systems were reviewed and were negative except as mentioned in HPI above. Negative for fever Negative for chest pain Negative for focal weakness  Past Medical  History:  Diagnosis Date  . Atrial fibrillation ([place])   . COPD (chronic obstructive pulmonary disease) ([place])   . Hypertension   . Prediabetes     Past Surgical History:  Procedure Laterality Date  . THUMB FUSION Bilateral      reports that he has been smoking cigarettes. He has been smoking about 1.50 packs per day. He has never used smokeless tobacco. He reports previous alcohol use of about 3.0 standard drinks of alcohol per week. He reports that he does not use drugs.  Allergies  Allergen Reactions  . Folic Acid Swelling    redness  . Hydrocodone Hives  . Trazodone And Nefazodone Swelling    Family History  Problem Relation Age of Onset  . Diabetes Mother   . Heart disease Father   . Heart disease Brother   . Diabetes Maternal Grandmother   . Heart disease Paternal Grandmother      Prior to Admission medications   Medication Sig Start Date End Date Taking? Authorizing Provider  albuterol (VENTOLIN HFA) 108 (90 Base) MCG/ACT inhaler Inhale 2 puffs into the lungs every 6 (six) hours as needed for wheezing or shortness of breath. 01/07/20   Argentina Donovan, PA-C  Blood Glucose Monitoring Suppl (TRUE METRIX METER) w/Device KIT 1 kit by Does not apply route daily. Use to check fasting blood sugar daily. 10/01/19   [name], Cammie, MD  diltiazem (DILACOR XR) 120 MG 24 hr capsule Take 2 capsules (240 mg total) by mouth 2 (two) times daily.  05/07/20 08/05/20  Hoy Register, MD  DULoxetine (CYMBALTA) 30 MG capsule Take 1 capsule (30 mg total) by mouth daily. 06/09/20 09/07/20  Storm Frisk, MD  furosemide (LASIX) 20 MG tablet TAKE ONE TABLET DAILY BY MOUTH FOR 3 DAYS THEN AS NEEDED FOR LEG SWELLING 08/04/20   Storm Frisk, MD  gabapentin (NEURONTIN) 300 MG capsule TAKE 2 CAPSULES (600 MG TOTAL) BY MOUTH 3 (THREE) TIMES DAILY. 08/04/20   Storm Frisk, MD  glucose blood test strip Use to check fasting blood sugar daily. 10/01/19   [name], Cammie, MD  nicotine polacrilex  (NICORETTE MINI) 4 MG lozenge Take three times a day to quit smoking 06/09/20   Storm Frisk, MD  rivaroxaban (XARELTO) 20 MG TABS tablet Take 1 tablet (20 mg total) by mouth daily with supper. 06/09/20   Storm Frisk, MD  thiamine 100 MG tablet Take 1 tablet (100 mg total) by mouth daily. 06/09/20   Storm Frisk, MD  traZODone (DESYREL) 50 MG tablet Take 1 tablet (50 mg total) by mouth at bedtime as needed for sleep. 03/09/20 06/07/20  Claiborne Rigg, NP  TRUEplus Lancets 28G MISC Use to check fasting blood sugar daily. 10/01/19   [name], Cammie, MD  valsartan (DIOVAN) 160 MG tablet Take 1 tablet (160 mg total) by mouth daily. 06/09/20   Storm Frisk, MD  vitamin B-12 (CYANOCOBALAMIN) 1000 MCG tablet Take 1,000 mcg by mouth daily.     [provider]    Physical Exam: Vitals:   08/31/20 1409 08/31/20 1429 08/31/20 1500 08/31/20 1530  BP:  120/79 138/84 123/84  Pulse:  79 75 (!) 56  Resp:  20 16 (!) 21  Temp:      TempSrc:      SpO2: 92% 94% 90% 91%  Weight:      Height:       General exam: AAOx3, obese,NAD, weak appearing. HEENT:Oral mucosa moist, Ear/Nose WNL grossly, dentition normal. Respiratory system: bilaterally extensive wheezing, no crackles,no use of accessory muscle Cardiovascular system: S1 & S2 +, No JVD,. Gastrointestinal system: Abdomen soft, NT,ND, BS+ Nervous System:Alert, awake, moving extremities and grossly nonfocal Extremities: No edema, distal peripheral pulses palpable.  Skin: No rashes,no icterus. MSK: Normal muscle bulk,tone, power   Labs on Admission: I have personally reviewed following labs and imaging studies  CBC: Recent Labs  Lab 08/31/20 0910 08/31/20 1003  WBC 14.7*  --   NEUTROABS 11.2*  --   HGB 14.3 14.3  HCT 42.0 42.0  MCV 101.7*  --   PLT 233  --    Basic Metabolic Panel: Recent Labs  Lab 08/31/20 0910 08/31/20 1003  NA 136 137  K 3.1* 3.3*  CL 91*  --   CO2 33*  --   GLUCOSE 162*  --   BUN 13  --    CREATININE 0.99  --   CALCIUM 8.6*  --    GFR: Estimated Creatinine Clearance: 114.3 mL/min (by C-G formula based on SCr of 0.99 mg/dL). Liver Function Tests: Recent Labs  Lab 08/31/20 0910  AST 33  ALT 24  ALKPHOS 93  BILITOT 0.9  PROT 7.7  ALBUMIN 3.3*   No results for input(s): LIPASE, AMYLASE in the last 168 hours. No results for input(s): AMMONIA in the last 168 hours. Coagulation Profile: No results for input(s): INR, PROTIME in the last 168 hours. Cardiac Enzymes: No results for input(s): CKTOTAL, CKMB, CKMBINDEX, TROPONINI in the last 168 hours. BNP (last 3  results) No results for input(s): PROBNP in the last 8760 hours. HbA1C: No results for input(s): HGBA1C in the last 72 hours. CBG: No results for input(s): GLUCAP in the last 168 hours. Lipid Profile: No results for input(s): CHOL, HDL, LDLCALC, TRIG, CHOLHDL, LDLDIRECT in the last 72 hours. Thyroid Function Tests: No results for input(s): TSH, T4TOTAL, FREET4, T3FREE, THYROIDAB in the last 72 hours. Anemia Panel: No results for input(s): VITAMINB12, FOLATE, FERRITIN, TIBC, IRON, RETICCTPCT in the last 72 hours. Urine analysis: No results found for: COLORURINE, APPEARANCEUR, [place], [place], GLUCOSEU, HGBUR, BILIRUBINUR, KETONESUR, PROTEINUR, UROBILINOGEN, NITRITE, LEUKOCYTESUR  Radiological Exams on Admission: DG Chest Portable 1 View  Result Date: 08/31/2020 CLINICAL DATA:  Cough.  Hypoxia. EXAM: PORTABLE CHEST 1 VIEW COMPARISON:  December 18, 2019 FINDINGS: Lungs are clear. Heart is mildly enlarged with pulmonary vascularity normal. No adenopathy. No bone lesions. IMPRESSION: Mild cardiac enlargement.  No edema or airspace opacity. Electronically Signed   By: Lowella Grip III M.D.   On: 08/31/2020 09:49    Assessment/Plan  Acute respiratory failure with hypoxia and hypercapnia: Differential COPD exacerbation high in the list- has had abt 20 years of smoking, wil r/o CHF with wt gain marginal bnp. No  pneumonia on cxr, no dysurea, has leucocytosis- we will cont on IV steroids, azithromycin and  Bronchodilators and he has felt significantly better after ED treatments. Check serial troponins and check echocardiogram. He takes [place] prn lasix at home. Already anticoagulated on Xarelto less likely PE and he does not endorse any chest pain. Will needs osa eval as OP and PUlm follow up.  Chronic Atrial fibrillation: Resume home anticoagulation Xarelto and hold cardizem as hr < 60.   Essential hypertension: Resume home ARBs.  Cigarette smoker-counseled. Nicotine patch.  Alcohol abuse/Alcohol-induced polyneuropathy: cont Neurontin/Cymbalta, thiamine folate and alcohol cessation.  Watch for withdrawal.  Last etoh 3-4 days ago.  Morbid obesity with BMI Body mass index is 32.82 kg/m.  Will need PCP follow-up and weight loss strategy  Severity of Illness: The appropriate patient status for this patient is INPATIENT. Inpatient status is judged to be reasonable and necessary in order to provide the required intensity of service to ensure the patient's safety. The patient's presenting symptoms, physical exam findings, and initial radiographic and laboratory data in the context of their chronic comorbidities is felt to place them at high risk for further clinical deterioration. Furthermore, it is not anticipated that the patient will be medically stable for discharge from the hospital within 2 midnights of admission.   DVT prophylaxis: xarelto Code Status:   Code Status: Full Code  Family Communication: Admission, patients condition and plan of care including tests being ordered have been discussed with the patient who indicate understanding and agree with the plan and Code Status.  Consults called:   Antonieta Pert MD [facility]  If 7PM-7AM, please contact night-coverage [email or web address]  08/31/2020, 5:41 PM

## 2020-08-31 NOTE — ED Provider Notes (Signed)
[facility] EMERGENCY DEPARTMENT Provider Note   CSN: [id number] Arrival date & time: 08/31/20  8502     History Chief Complaint  Patient presents with  . Shortness of Breath    Kevin Duncan is a 50 y.o. male.  HPI 51 year old male presents with shortness of breath.  He has been feeling this way for about a week.  He is short of breath with any type of exertion.  He has had a cough with some mild sputum but no blood.  No chest pain or fevers.  No leg swelling.  He has been gaining weight over the last 3 or 4 months, to about 20 pounds.  Leg swell on and off but not particularly now.  He has been noticing worse shortness of breath when laying flat and even some PND.  Past Medical History:  Diagnosis Date  . Atrial fibrillation ([place])   . Hypertension   . Prediabetes     Patient Active Problem List   Diagnosis Date Noted  . Acute respiratory failure with hypoxia ([place]) 08/31/2020  . Chronic anticoagulation 06/09/2020  . 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 Alcohol abuse 12/19/2019  . Essential hypertension 02/05/2019  . Cigarette smoker 02/05/2019  . Atrial fibrillation [facility])     Past Surgical History:  Procedure Laterality Date  . THUMB FUSION Bilateral        Family History  Problem Relation Age of Onset  . Diabetes Mother   . Heart disease Father   . Heart disease Brother   . Diabetes Maternal Grandmother   . Heart disease Paternal Grandmother     Social History   Tobacco Use  . Smoking status: Current Every Day Smoker    Packs/day: 1.50    Types: Cigarettes  . Smokeless tobacco: Never Used  . Tobacco comment: 3/4 pack daily  Substance Use Topics  . Alcohol use: Not Currently    Alcohol/week: 3.0 standard drinks    Types: 3 Standard drinks or equivalent per week    Comment: 5-6 beer daily  . Drug use: No    Home Medications Prior to Admission medications   Medication Sig Start Date End Date Taking? Authorizing Provider   albuterol (VENTOLIN HFA) 108 (90 Base) MCG/ACT inhaler Inhale 2 puffs into the lungs every 6 (six) hours as needed for wheezing or shortness of breath. 01/07/20   Argentina Donovan, PA-C  Blood Glucose Monitoring Suppl (TRUE METRIX METER) w/Device KIT 1 kit by Does not apply route daily. Use to check fasting blood sugar daily. 10/01/19   [name], Cammie, MD  diltiazem (DILACOR XR) 120 MG 24 hr capsule Take 2 capsules (240 mg total) by mouth 2 (two) times daily. 05/07/20 08/05/20  Charlott Rakes, MD  DULoxetine (CYMBALTA) 30 MG capsule Take 1 capsule (30 mg total) by mouth daily. 06/09/20 09/07/20  Elsie Stain, MD  furosemide (LASIX) 20 MG tablet TAKE ONE TABLET DAILY BY MOUTH FOR 3 DAYS THEN AS NEEDED FOR LEG SWELLING 08/04/20   Elsie Stain, MD  gabapentin (NEURONTIN) 300 MG capsule TAKE 2 CAPSULES (600 MG TOTAL) BY MOUTH 3 (THREE) TIMES DAILY. 08/04/20   Elsie Stain, MD  glucose blood test strip Use to check fasting blood sugar daily. 10/01/19   [name], Cammie, MD  nicotine polacrilex (NICORETTE MINI) 4 MG lozenge Take three times a day to quit smoking 06/09/20   Elsie Stain, MD  rivaroxaban (XARELTO) 20 MG TABS tablet Take 1 tablet (20 mg total) by mouth  daily with supper. 06/09/20   Elsie Stain, MD  thiamine 100 MG tablet Take 1 tablet (100 mg total) by mouth daily. 06/09/20   Elsie Stain, MD  traZODone (DESYREL) 50 MG tablet Take 1 tablet (50 mg total) by mouth at bedtime as needed for sleep. 03/09/20 06/07/20  Gildardo Pounds, NP  TRUEplus Lancets 28G MISC Use to check fasting blood sugar daily. 10/01/19   [name], Cammie, MD  valsartan (DIOVAN) 160 MG tablet Take 1 tablet (160 mg total) by mouth daily. 06/09/20   Elsie Stain, MD  vitamin B-12 (CYANOCOBALAMIN) 1000 MCG tablet Take 1,000 mcg by mouth daily.     [provider]    Allergies    Folic acid and Hydrocodone  Review of Systems   Review of Systems  Constitutional: Negative for fever.   Respiratory: Positive for cough and shortness of breath.   Cardiovascular: Negative for chest pain and leg swelling.  All other systems reviewed and are negative.   Physical Exam Updated Vital Signs BP 136/71   Pulse 93   Temp 98.6 F (37 C) (Oral)   Resp (!) 21   Ht 6' (1.829 m)   Wt 109.8 kg   SpO2 (!) 88%   BMI 32.82 kg/m   Physical Exam Vitals and nursing note reviewed.  Constitutional:      Appearance: He is well-developed.  HENT:     Head: Normocephalic and atraumatic.     Right Ear: External ear normal.     Left Ear: External ear normal.     Nose: Nose normal.  Eyes:     General:        Right eye: No discharge.        Left eye: No discharge.  Cardiovascular:     Rate and Rhythm: Normal rate. Rhythm irregular.     Heart sounds: Normal heart sounds.  Pulmonary:     Effort: Pulmonary effort is normal. Tachypnea present. No accessory muscle usage or respiratory distress.     Breath sounds: Wheezing (diffuse, expiratory) present.  Abdominal:     Palpations: Abdomen is soft.     Tenderness: There is no abdominal tenderness.  Musculoskeletal:     Cervical back: Neck supple.     Right lower leg: No edema.     Left lower leg: No edema.  Skin:    General: Skin is warm and dry.  Neurological:     Mental Status: He is alert.  Psychiatric:        Mood and Affect: Mood is not anxious.     ED Results / Procedures / Treatments   Labs (all labs ordered are listed, but only abnormal results are displayed) Labs Reviewed  COMPREHENSIVE METABOLIC PANEL - Abnormal; Notable for the following components:      Result Value   Potassium 3.1 (*)    Chloride 91 (*)    CO2 33 (*)    Glucose, Bld 162 (*)    Calcium 8.6 (*)    Albumin 3.3 (*)    All other components within normal limits  BRAIN NATRIURETIC PEPTIDE - Abnormal; Notable for the following components:   B Natriuretic Peptide 232.2 (*)    All other components within normal limits  CBC WITH DIFFERENTIAL/PLATELET  - Abnormal; Notable for the following components:   WBC 14.7 (*)    RBC 4.13 (*)    MCV 101.7 (*)    MCH 34.6 (*)    Neutro Abs 11.2 (*)  Monocytes Absolute 1.8 (*)    Abs Immature Granulocytes 0.09 (*)    All other components within normal limits  I-STAT ARTERIAL BLOOD GAS, ED - Abnormal; Notable for the following components:   pH, Arterial 7.468 (*)    pCO2 arterial 50.8 (*)    pO2, Arterial 56 (*)    Bicarbonate 36.8 (*)    TCO2 38 (*)    Acid-Base Excess 11.0 (*)    Potassium 3.3 (*)    All other components within normal limits  RESP PANEL BY RT-PCR (FLU A&B, COVID) ARPGX2  TROPONIN I (HIGH SENSITIVITY)    EKG EKG Interpretation  Date/Time:  Tuesday August 31 2020 09:03:49 EDT Ventricular Rate:  76 PR Interval:    QRS Duration: 85 QT Interval:  367 QTC Calculation: 413 R Axis:   81 Text Interpretation: Atrial fibrillation Probable anteroseptal infarct, old Confirmed by Sherwood Gambler [phone]) on 08/31/2020 9:10:10 AM Also confirmed by Sherwood Gambler [phone]), editor Lynder Parents [phone])  on 08/31/2020 11:07:58 AM   Radiology DG Chest Portable 1 View  Result Date: 08/31/2020 CLINICAL DATA:  Cough.  Hypoxia. EXAM: PORTABLE CHEST 1 VIEW COMPARISON:  December 18, 2019 FINDINGS: Lungs are clear. Heart is mildly enlarged with pulmonary vascularity normal. No adenopathy. No bone lesions. IMPRESSION: Mild cardiac enlargement.  No edema or airspace opacity. Electronically Signed   By: Lowella Grip III M.D.   On: 08/31/2020 09:49    Procedures Procedures   Medications Ordered in ED Medications  nicotine (NICODERM CQ - dosed in mg/24 hours) patch 21 mg (21 mg Transdermal Patient Refused/Not Given 08/31/20 1124)  azithromycin (ZITHROMAX) 500 mg in sodium chloride 0.9 % 250 mL IVPB (500 mg Intravenous New Bag/Given 08/31/20 1123)  albuterol (PROVENTIL) (2.5 MG/3ML) 0.083% nebulizer solution 5 mg (has no administration in time range)  albuterol (VENTOLIN HFA) 108 (90 Base)  MCG/ACT inhaler 8 puff (8 puffs Inhalation Given 08/31/20 0927)  methylPREDNISolone sodium succinate (SOLU-MEDROL) 125 mg/2 mL injection 125 mg (125 mg Intravenous Given 08/31/20 0922)  albuterol (PROVENTIL) (2.5 MG/3ML) 0.083% nebulizer solution 2.5 mg (2.5 mg Nebulization Given 08/31/20 1030)  ipratropium-albuterol (DUONEB) 0.5-2.5 (3) MG/3ML nebulizer solution 3 mL (3 mLs Nebulization Given 08/31/20 1030)    ED Course  I have reviewed the triage vital signs and the nursing notes.  Pertinent labs & imaging results that were available during my care of the patient were reviewed by me and considered in my medical decision making (see chart for details).    MDM Rules/Calculators/A&P                          Patient is starting to feel better with albuterol treatments. His covid testing is negative. His chest xray shows no obvious pneumonia. No external signs of CHF. Has a nonspecific BNP. If albuterol doesn't keep improving symptoms, will try lasix. Otherwise, he is still requiring O2 and will need admission. Discussed with Dr. Neysa Bonito.  Final Clinical Impression(s) / ED Diagnoses Final diagnoses:  Acute respiratory failure with hypoxia ([place])  COPD exacerbation [facility])    Rx / DC Orders ED Discharge Orders    None       Sherwood Gambler, MD 08/31/20 1217

## 2020-08-31 NOTE — ED Notes (Signed)
Prior to triage SpO2 77% on r/a after walking from RR in lobby.  Patient to Room 1 in [facility], placed on 4l/m [place] SpO2 increased to 92% slowly,  BBS wit exp wheezes t/o.

## 2020-08-31 NOTE — ED Triage Notes (Signed)
Reports shortness of breath x 1 week, cough, Hx Afib. Denies chest pain

## 2020-09-01 ENCOUNTER — Inpatient Hospital Stay (HOSPITAL_COMMUNITY): Payer: Self-pay

## 2020-09-01 DIAGNOSIS — I5031 Acute diastolic (congestive) heart failure: Secondary | ICD-10-CM

## 2020-09-01 LAB — COMPREHENSIVE METABOLIC PANEL
ALT: 24 U/L (ref 0–44)
AST: 26 U/L (ref 15–41)
Albumin: 3 g/dL — ABNORMAL LOW (ref 3.5–5.0)
Alkaline Phosphatase: 90 U/L (ref 38–126)
Anion gap: 14 (ref 5–15)
BUN: 14 mg/dL (ref 6–20)
CO2: 27 mmol/L (ref 22–32)
Calcium: 9 mg/dL (ref 8.9–10.3)
Chloride: 99 mmol/L (ref 98–111)
Creatinine, Ser: 0.85 mg/dL (ref 0.61–1.24)
GFR, Estimated: >60 mL/min (ref >60)
Glucose, Bld: 184 mg/dL — ABNORMAL HIGH (ref 70–99)
Potassium: 3.9 mmol/L (ref 3.5–5.1)
Sodium: 140 mmol/L (ref 135–145)
Total Bilirubin: 0.6 mg/dL (ref 0.3–1.2)
Total Protein: 7.1 g/dL (ref 6.5–8.1)

## 2020-09-01 LAB — CBC
HCT: 48.8 % (ref 39.0–52.0)
Hemoglobin: 16.2 g/dL (ref 13.0–17.0)
MCH: 34.3 pg — ABNORMAL HIGH (ref 26.0–34.0)
MCHC: 33.2 g/dL (ref 30.0–36.0)
MCV: 103.4 fL — ABNORMAL HIGH (ref 80.0–100.0)
Platelets: 282 10*3/uL (ref 150–400)
RBC: 4.72 MIL/uL (ref 4.22–5.81)
RDW: 12.6 % (ref 11.5–15.5)
WBC: 16.7 10*3/uL — ABNORMAL HIGH (ref 4.0–10.5)
nRBC: 0 % (ref 0.0–0.2)

## 2020-09-01 LAB — ECHOCARDIOGRAM COMPLETE
Height: 72 in
S' Lateral: 4.4 cm
Weight: 3756.64 oz

## 2020-09-01 LAB — BRAIN NATRIURETIC PEPTIDE: B Natriuretic Peptide: 427.4 pg/mL — ABNORMAL HIGH (ref 0.0–100.0)

## 2020-09-01 MED ORDER — ALBUTEROL SULFATE (2.5 MG/3ML) 0.083% IN NEBU
2.5000 mg | INHALATION_SOLUTION | Freq: Four times a day (QID) | RESPIRATORY_TRACT | Status: DC
  Administered 2020-09-01 – 2020-09-02 (×2): 2.5 mg via RESPIRATORY_TRACT
  Filled 2020-09-01 (×5): qty 3

## 2020-09-01 MED ORDER — DILTIAZEM HCL 30 MG PO TABS
30.0000 mg | ORAL_TABLET | Freq: Three times a day (TID) | ORAL | Status: DC
  Administered 2020-09-01 – 2020-09-02 (×3): 30 mg via ORAL
  Filled 2020-09-01 (×4): qty 1

## 2020-09-01 MED ORDER — FUROSEMIDE 10 MG/ML IJ SOLN
40.0000 mg | Freq: Once | INTRAMUSCULAR | Status: AC
  Administered 2020-09-01: 40 mg via INTRAVENOUS
  Filled 2020-09-01: qty 4

## 2020-09-01 MED ORDER — UMECLIDINIUM-VILANTEROL 62.5-25 MCG/INH IN AEPB
1.0000 | INHALATION_SPRAY | Freq: Every day | RESPIRATORY_TRACT | Status: DC
  Administered 2020-09-01 – 2020-09-04 (×4): 1 via RESPIRATORY_TRACT
  Filled 2020-09-01: qty 14

## 2020-09-01 NOTE — Progress Notes (Signed)
PROGRESS NOTE    Kevin Duncan  GLO:[id number] DOB: Jan 24, 1971 DOA: 08/31/2020 PCP: Storm Frisk, MD   Chief Complaint  Patient presents with  . Shortness of Breath  Brief Narrative: 50 y.o. male with medical history significant for A. 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 He endorses weight gain about  20 lb in 3 to 4 months, on and off leg swelling and sob worse on laying flat and during night.  ED Course: Was tachypneic saturating 85% room air needing 4l [place] to maintain his pulse ox to 96%, exam with diffuse expiratory wheezing, EKG rate controlled A. Fib, lab with elevated BNP troponin negative chest x-ray cardiomegaly. abg- pco2 50, po2 56, ph 7,4, hypokalemia 3.3, leukocytosis 14.7K.  COVID-19 negative, patient was given nicotine, Zithromax, bronchodilators, Solu-Medrol 125 mg, and admission was requested for acute respiratory failure with hypoxia and COPD exacerbation On my eval he is aaox3, he reports he feels 80% better after nebs and steroid earlier in the ED. He is currently wheezing. He has had no chest pain.Patient otherwise denies any nausea, vomiting,fever, chills, headache, focal weakness, numbness tingling, speech difficulties  Subjective: Seen and examined this morning.  He is not tolerating nasal cannula reports significant improvement in shortness of breath but is still wheezing.  He has not ambulated yet. Producing some sputum  Assessment & Plan:  Acute respiratory failure with hypoxia and hypercapnia:  Suspecting acute COPD exacerbation.  Not formally diagnosed COPD, has extensive bilateral wheezing improving with Solu-Medrol bronchodilators, start LABA/LAMA-anoro ellipta. He has had  abt > 20 years of smoking hx. had some elevated BNP and at home takes as needed Lasix echo pending to rule out CHF.  Will dose with Lasix 40 mg x 1 iv. continue azithromycin.  Monitor CBC given leukocytosis no obvious pneumonia in chest x-ray.  His serial troponins have been negative.  Already anticoagulated on Xarelto.  Chronic Atrial fibrillation:  Rate controlled, continue his Xarelto, resume Cardizem po.  Essential hypertension:  BP well well controlled, continue his ARBs, po cardizem.  Cigarette smoker-he has been counseled to quit.  Does not want nicotine for now.  Alcohol abuse/Alcohol-induced polyneuropathy: No signs of withdrawal.  Last EtOH use 3 to 4 days PTA.  Cont thiamine.    Morbid obesity with BMI Body mass index is 32.82 kg/m.  He will need outpatient follow-up with PCP and weight loss strategy.  Diet Order            Diet Heart Room service appropriate? Yes; Fluid consistency: Thin  Diet effective now                Patient's Body mass index is 31.84 kg/m.  DVT prophylaxis: SCDs Start: 08/31/20 1735 XARELTO Code Status:   Code Status: Full Code  Family Communication: plan of care discussed with patient at bedside.  Status is: Inpatient Remains inpatient appropriate because:IV treatments appropriate due to intensity of illness or inability to take PO, Inpatient level of care appropriate due to severity of illness and Ongoing hypoxic respiratory failure   Dispo: The patient is from: Home              Anticipated d/c is to: Home 1 to 2 days once respiratory failure improves.  Obtain PT eval and ambulation today.  Patient currently is not medically stable to d/c.   Difficult to place patient No  Unresulted Labs (From admission, onward)         None    Medications reviewed:  Scheduled Meds: . azithromycin  500 mg Oral Daily  . DULoxetine  30 mg Oral Daily  . furosemide  20 mg Oral Daily  . gabapentin  600 mg Oral TID  .  ipratropium-albuterol  3 mL Nebulization TID  . irbesartan  150 mg Oral Daily  . methylPREDNISolone (SOLU-MEDROL) injection  40 mg Intravenous Q6H   Followed by  . [START ON 09/03/2020] predniSONE  40 mg Oral Q breakfast  . nicotine  21 mg Transdermal Daily  . rivaroxaban  20 mg Oral Q supper  . thiamine  100 mg Oral Daily  . vitamin B-12  1,000 mcg Oral Daily   Continuous Infusions:  Consultants:see note  Procedures:see note  Antimicrobials: Anti-infectives (From admission, onward)   Start     Dose/Rate Route Frequency Ordered Stop   09/01/20 1000  azithromycin (ZITHROMAX) tablet 500 mg        500 mg Oral Daily 08/31/20 1745     08/31/20 1100  azithromycin (ZITHROMAX) 500 mg in sodium chloride 0.9 % 250 mL IVPB        500 mg 250 mL/hr over 60 Minutes Intravenous  Once 08/31/20 1050 08/31/20 1256     Culture/Microbiology    Component Value Date/Time   SDES ABSCESS RIGHT FINGER INDEX 08/17/2007 1738   SPECREQUEST NONE 08/17/2007 1738   CULT  08/17/2007 1738    ABUNDANT METHICILLIN RESISTANT STAPHYLOCOCCUS AUREUS Note: RIFAMPIN AND GENTAMICIN SHOULD NOT BE USED AS SINGLE DRUGS FOR TREATMENT OF STAPH INFECTIONS. This organism DOES NOT demonstrate inducible Clindamycin resistance in vitro. CRITICAL RESULT CALLED TO, READ BACK BY AND VERIFIED WITH: BREAD 08/20/07 AT  830 AM BY [facility]   REPTSTATUS 08/20/2007 FINAL 08/17/2007 1738    Other culture-see note  Objective: Vitals: Today's Vitals   09/01/20 0402 09/01/20 0500 09/01/20 0754 09/01/20 0859  BP: 130/63     Pulse: 69     Resp: 16     Temp: 98.1 F (36.7 C)     TempSrc: Oral     SpO2: 97%  93%   Weight:  106.5 kg    Height:      PainSc:    0-No pain    Intake/Output Summary (Last 24 hours) at 09/01/2020 0959 Last data filed at 09/01/2020 0900 Gross per 24 hour  Intake 720 ml  Output 500 ml  Net 220 ml   Filed Weights   08/31/20 0904 09/01/20 0500  Weight: 109.8 kg 106.5 kg   Weight change:   Intake/Output  from previous day: 03/15 0701 - 03/16 0700 In: 480 [P.O.:480] Out: 500 [Urine:500] Intake/Output this shift: Total I/O In: 240 [P.O.:240] Out: -  Filed Weights   08/31/20 0904 09/01/20 0500  Weight: 109.8 kg 106.5 kg    Examination: General exam:AAO x3,NAD, weak appearing. HEENT:Oral mucosa moist, Ear/Nose WNL grossly,dentition normal. Respiratory system: bilaterally diminished with diffuse expiratory wheezing,no use of accessory muscle, non tender. Cardiovascular system: S1 & S2 +, regular, No JVD. Gastrointestinal system: Abdomen soft, NT,ND, BS+. Nervous System:Alert, awake, moving extremities and grossly nonfocal Extremities: MILD LEG edema, distal peripheral pulses palpable.  Skin: No rashes,no icterus. MSK: Normal muscle bulk,tone, power  Data Reviewed: I have personally reviewed following labs and imaging studies CBC: Recent Labs  Lab 08/31/20 0910 08/31/20 1003  WBC 14.7*  --  NEUTROABS 11.2*  --   HGB 14.3 14.3  HCT 42.0 42.0  MCV 101.7*  --   PLT 233  --    Basic Metabolic Panel: Recent Labs  Lab 08/31/20 0910 08/31/20 1003 09/01/20 0417  NA 136 137 140  K 3.1* 3.3* 3.9  CL 91*  --  99  CO2 33*  --  27  GLUCOSE 162*  --  184*  BUN 13  --  14  CREATININE 0.99  --  0.85  CALCIUM 8.6*  --  9.0   GFR: Estimated Creatinine Clearance: 131.2 mL/min (by C-G formula based on SCr of 0.85 mg/dL). Liver Function Tests: Recent Labs  Lab 08/31/20 0910 09/01/20 0417  AST 33 26  ALT 24 24  ALKPHOS 93 90  BILITOT 0.9 0.6  PROT 7.7 7.1  ALBUMIN 3.3* 3.0*   No results for input(s): LIPASE, AMYLASE in the last 168 hours. No results for input(s): AMMONIA in the last 168 hours. Coagulation Profile: No results for input(s): INR, PROTIME in the last 168 hours. Cardiac Enzymes: No results for input(s): CKTOTAL, CKMB, CKMBINDEX, TROPONINI in the last 168 hours. BNP (last 3 results) No results for input(s): PROBNP in the last 8760 hours. HbA1C: No results  for input(s): HGBA1C in the last 72 hours. CBG: No results for input(s): GLUCAP in the last 168 hours. Lipid Profile: No results for input(s): CHOL, HDL, LDLCALC, TRIG, CHOLHDL, LDLDIRECT in the last 72 hours. Thyroid Function Tests: No results for input(s): TSH, T4TOTAL, FREET4, T3FREE, THYROIDAB in the last 72 hours. Anemia Panel: No results for input(s): VITAMINB12, FOLATE, FERRITIN, TIBC, IRON, RETICCTPCT in the last 72 hours. Sepsis Labs: No results for input(s): PROCALCITON, LATICACIDVEN in the last 168 hours.  Recent Results (from the past 240 hour(s))  Resp Panel by RT-PCR (Flu A&B, Covid) Nasopharyngeal Swab     Status: None   Collection Time: 08/31/20  9:10 AM   Specimen: Nasopharyngeal Swab; Nasopharyngeal(NP) swabs in vial transport medium  Result Value Ref Range Status   SARS Coronavirus 2 by RT PCR NEGATIVE NEGATIVE Final    Comment: (NOTE) SARS-CoV-2 target nucleic acids are NOT DETECTED.  The SARS-CoV-2 RNA is generally detectable in upper respiratory specimens during the acute phase of infection. The lowest concentration of SARS-CoV-2 viral copies this assay can detect is 138 copies/mL. A negative result does not preclude SARS-Cov-2 infection and should not be used as the sole basis for treatment or other patient management decisions. A negative result may occur with  improper specimen collection/handling, submission of specimen other than nasopharyngeal swab, presence of viral mutation(s) within the areas targeted by this assay, and inadequate number of viral copies(<138 copies/mL). A negative result must be combined with clinical observations, patient history, and epidemiological information. The expected result is Negative.  Fact Sheet for Patients:  BloggerCourse.com  Fact Sheet for Healthcare Providers:  SeriousBroker.it  This test is no t yet approved or cleared by the [place] FDA and  has been  authorized for detection and/or diagnosis of SARS-CoV-2 by FDA under an Emergency Use Authorization (EUA). This EUA will remain  in effect (meaning this test can be used) for the duration of the COVID-19 declaration under Section 564(b)(1) of the Act, 21 U.S.C.section 360bbb-3(b)(1), unless the authorization is terminated  or revoked sooner.       Influenza A by PCR NEGATIVE NEGATIVE Final   Influenza B by PCR NEGATIVE NEGATIVE Final    Comment: (NOTE) The Xpert Xpress SARS-CoV-2/FLU/RSV plus assay is intended  as an aid in the diagnosis of influenza from Nasopharyngeal swab specimens and should not be used as a sole basis for treatment. Nasal washings and aspirates are unacceptable for Xpert Xpress SARS-CoV-2/FLU/RSV testing.  Fact Sheet for Patients: BloggerCourse.com  Fact Sheet for Healthcare Providers: SeriousBroker.it  This test is not yet approved or cleared by the [place] FDA and has been authorized for detection and/or diagnosis of SARS-CoV-2 by FDA under an Emergency Use Authorization (EUA). This EUA will remain in effect (meaning this test can be used) for the duration of the COVID-19 declaration under Section 564(b)(1) of the Act, 21 U.S.C. section 360bbb-3(b)(1), unless the authorization is terminated or revoked.  Performed at [facility], [address] [place], [place] [address]      Radiology Studies: DG Chest Portable 1 View  Result Date: 08/31/2020 CLINICAL DATA:  Cough.  Hypoxia. EXAM: PORTABLE CHEST 1 VIEW COMPARISON:  December 18, 2019 FINDINGS: Lungs are clear. Heart is mildly enlarged with pulmonary vascularity normal. No adenopathy. No bone lesions. IMPRESSION: Mild cardiac enlargement.  No edema or airspace opacity. Electronically Signed   By: Bretta Bang III M.D.   On: 08/31/2020 09:49     LOS: 1 day   Lanae Boast, MD [facility]  09/01/2020, 9:59 AM

## 2020-09-01 NOTE — Plan of Care (Signed)

## 2020-09-01 NOTE — Progress Notes (Signed)
  Echocardiogram 2D Echocardiogram has been performed.  Kevin Duncan 09/01/2020, 2:49 PM

## 2020-09-02 LAB — BASIC METABOLIC PANEL
Anion gap: 12 (ref 5–15)
BUN: 28 mg/dL — ABNORMAL HIGH (ref 6–20)
CO2: 31 mmol/L (ref 22–32)
Calcium: 9.1 mg/dL (ref 8.9–10.3)
Chloride: 97 mmol/L — ABNORMAL LOW (ref 98–111)
Creatinine, Ser: 0.99 mg/dL (ref 0.61–1.24)
GFR, Estimated: >60 mL/min (ref >60)
Glucose, Bld: 174 mg/dL — ABNORMAL HIGH (ref 70–99)
Potassium: 4.1 mmol/L (ref 3.5–5.1)
Sodium: 140 mmol/L (ref 135–145)

## 2020-09-02 LAB — CBC
HCT: 48.4 % (ref 39.0–52.0)
Hemoglobin: 15.9 g/dL (ref 13.0–17.0)
MCH: 34.4 pg — ABNORMAL HIGH (ref 26.0–34.0)
MCHC: 32.9 g/dL (ref 30.0–36.0)
MCV: 104.8 fL — ABNORMAL HIGH (ref 80.0–100.0)
Platelets: 302 10*3/uL (ref 150–400)
RBC: 4.62 MIL/uL (ref 4.22–5.81)
RDW: 12.7 % (ref 11.5–15.5)
WBC: 17.3 10*3/uL — ABNORMAL HIGH (ref 4.0–10.5)
nRBC: 0 % (ref 0.0–0.2)

## 2020-09-02 LAB — PROCALCITONIN: Procalcitonin: <0.1 ng/mL

## 2020-09-02 MED ORDER — LEVALBUTEROL TARTRATE 45 MCG/ACT IN AERO
1.0000 | INHALATION_SPRAY | Freq: Four times a day (QID) | RESPIRATORY_TRACT | Status: DC | PRN
  Filled 2020-09-02: qty 15

## 2020-09-02 MED ORDER — DILTIAZEM HCL ER COATED BEADS 240 MG PO CP24: 240.0000 mg | ORAL_CAPSULE | Freq: Two times a day (BID) | ORAL | Status: DC

## 2020-09-02 MED ORDER — LEVALBUTEROL HCL 0.63 MG/3ML IN NEBU
0.6300 mg | INHALATION_SOLUTION | Freq: Three times a day (TID) | RESPIRATORY_TRACT | Status: DC
  Administered 2020-09-03 – 2020-09-04 (×4): 0.63 mg via RESPIRATORY_TRACT
  Filled 2020-09-02 (×4): qty 3

## 2020-09-02 MED ORDER — GUAIFENESIN-DM 100-10 MG/5ML PO SYRP
10.0000 mL | ORAL_SOLUTION | ORAL | Status: DC | PRN
  Administered 2020-09-02: 10 mL via ORAL
  Filled 2020-09-02: qty 10

## 2020-09-02 MED ORDER — METOPROLOL TARTRATE 5 MG/5ML IV SOLN: 5.0000 mg | INTRAVENOUS | Status: DC | PRN

## 2020-09-02 MED ORDER — DILTIAZEM HCL ER COATED BEADS 240 MG PO CP24
240.0000 mg | ORAL_CAPSULE | Freq: Two times a day (BID) | ORAL | Status: DC
  Administered 2020-09-02 – 2020-09-04 (×4): 240 mg via ORAL
  Filled 2020-09-02 (×4): qty 1

## 2020-09-02 MED ORDER — LEVALBUTEROL HCL 0.63 MG/3ML IN NEBU
0.6300 mg | INHALATION_SOLUTION | Freq: Three times a day (TID) | RESPIRATORY_TRACT | Status: DC
  Administered 2020-09-02: 0.63 mg via RESPIRATORY_TRACT
  Filled 2020-09-02: qty 3

## 2020-09-02 MED ORDER — DILTIAZEM HCL ER COATED BEADS 240 MG PO CP24
240.0000 mg | ORAL_CAPSULE | Freq: Every day | ORAL | Status: DC
  Administered 2020-09-02: 240 mg via ORAL
  Filled 2020-09-02: qty 1

## 2020-09-02 MED ORDER — METHYLPREDNISOLONE SODIUM SUCC 125 MG IJ SOLR
60.0000 mg | Freq: Three times a day (TID) | INTRAMUSCULAR | Status: AC
  Administered 2020-09-02 – 2020-09-03 (×4): 60 mg via INTRAVENOUS
  Filled 2020-09-02 (×4): qty 2

## 2020-09-02 NOTE — Evaluation (Signed)
Physical Therapy Evaluation Patient Details Name: Kevin Duncan MRN: [id number] DOB: 03/11/1971 Today's Date: 09/02/2020   History of Present Illness  Kevin Duncan is a 50 y.o. male with medical history significant for A-fib, alcohol abuse, alcohol induced polyneuropathy, HTN, COPD with current c/o shortness of breath for [redacted] week along with cough, mild sputum without blood, dyspnea on exertion, and weight gain ~20 lb in 3 to 4 months, on and off leg swelling and SOB worse with laying flat at night.  Clinical Impression  Pt admitted with above diagnosis. At baseline, pt independent with mobility, works "some", girlfriend provides transportation and denies falls. Pt currently independent with bed mobility and transfers, able to complete 5xSTS in 10 seconds without UE assisting and no unsteadiness noted. Pt's HR up to 188bpm with 5x STS, remains 130-150 while seated EOB after- RN notified and in room at EOS. Pt on 3L O2 with SpO2 >95%. Educated pt on holding ambulation due to elevated HR and pt unhappy, but ultimately agreeable. Pt currently with functional limitations due to the deficits listed below (see PT Problem List). Pt will benefit from skilled PT to increase their independence and safety with mobility to allow discharge to the venue listed below.       Follow Up Recommendations No PT follow up    Equipment Recommendations  None recommended by PT    Recommendations for Other Services       Precautions / Restrictions Precautions Precautions: Other (comment) Precaution Comments: monitor HR Restrictions Weight Bearing Restrictions: No      Mobility  Bed Mobility Overal bed mobility: Independent   Transfers Overall transfer level: Independent Equipment used: None  General transfer comment: 5x STS in 10 seconds without UE assisting, HR up to 188bpm without complaints  Ambulation/Gait  General Gait Details: not attempted due to HR 188bpm with STS transfers  Stairs             Wheelchair Mobility    Modified Rankin (Stroke Patients Only)       Balance Overall balance assessment: No apparent balance deficits (not formally assessed)        Pertinent Vitals/Pain Pain Assessment: No/denies pain    Home Living Family/patient expects to be discharged to:: Private residence Living Arrangements: Spouse/significant other Available Help at Discharge: Family;Available PRN/intermittently Type of Home: Mobile home Home Access: Stairs to enter Entrance Stairs-Rails: Left Entrance Stairs-Number of Steps: 6 Home Layout: One level Home Equipment: None      Prior Function Level of Independence: Independent  Comments: Pt reports independent with community ambulation, ADLs, doesn't work, denies falls, reports girlfriend provides transportation.     Hand Dominance        Extremity/Trunk Assessment   Upper Extremity Assessment Upper Extremity Assessment: Overall WFL for tasks assessed    Lower Extremity Assessment Lower Extremity Assessment: Overall WFL for tasks assessed    Cervical / Trunk Assessment Cervical / Trunk Assessment: Normal  Communication   Communication: No difficulties  Cognition Arousal/Alertness: Awake/alert Behavior During Therapy: WFL for tasks assessed/performed Overall Cognitive Status: Within Functional Limits for tasks assessed     General Comments      Exercises     Assessment/Plan    PT Assessment Patient needs continued PT services  PT Problem List Decreased activity tolerance       PT Treatment Interventions DME instruction;Gait training;Stair training;Functional mobility training;Therapeutic activities;Therapeutic exercise;Balance training    PT Goals (Current goals can be found in the Care Plan section)  Acute Rehab  PT Goals Patient Stated Goal: return home today PT Goal Formulation: With patient Time For Goal Achievement: 09/16/20 Potential to Achieve Goals: Good    Frequency Min 3X/week    Barriers to discharge        Co-evaluation               AM-PAC PT "6 Clicks" Mobility  Outcome Measure Help needed turning from your back to your side while in a flat bed without using bedrails?: None Help needed moving from lying on your back to sitting on the side of a flat bed without using bedrails?: None Help needed moving to and from a bed to a chair (including a wheelchair)?: None Help needed standing up from a chair using your arms (e.g., wheelchair or bedside chair)?: None Help needed to walk in hospital room?: A Little Help needed climbing 3-5 steps with a railing? : A Little 6 Click Score: 22    End of Session   Activity Tolerance: Treatment limited secondary to medical complications (Comment) (elevated HR) Patient left: in bed;with call bell/phone within reach;with nursing/sitter in room Nurse Communication: Mobility status;Other (comment) (elevated HR) PT Visit Diagnosis: Other abnormalities of gait and mobility (R26.89)    Time: 1440-1500 PT Time Calculation (min) (ACUTE ONLY): 20 min   Charges:   PT Evaluation $PT Eval Low Complexity: 1 Low           Tori Broussard PT, DPT 09/02/20, 3:14 PM

## 2020-09-02 NOTE — Progress Notes (Addendum)
PROGRESS NOTE    Hazel D Remlinger  MVH:[id number] [id number]:[id number] DOB: 05/22/1971 DOA: 08/31/2020 PCP: Storm FriskWright, Patrick E, MD   Chief Complaint  Patient presents with  . Shortness of Breath  Brief Narrative: 50 y.o. male with medical history significant for A. 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 He endorses weight gain about  20 lb in 3 to 4 months, on and off leg swelling and sob worse on laying flat and during night.  ED Course: Was tachypneic saturating 85% room air needing 4l Wallace to maintain his pulse ox to 96%, exam with diffuse expiratory wheezing, EKG rate controlled A. Fib, lab with elevated BNP troponin negative chest x-ray cardiomegaly. abg- pco2 50, po2 56, ph 7,4, hypokalemia 3.3, leukocytosis 14.7K.  COVID-19 negative, patient was given nicotine, Zithromax, bronchodilators, Solu-Medrol 125 mg, and admission was requested for acute respiratory failure with hypoxia and COPD exacerbation On my eval he is aaox3, he reports he feels 80% better after nebs and steroid earlier in the ED. He is currently wheezing. He has had no chest pain.Patient otherwise denies any nausea, vomiting,fever, chills, headache, focal weakness, numbness tingling, speech difficulties Patient admitted for COPD exacerbation with extensive bilateral wheezing, and need for supplemental oxygen.  Subjective:  Seen and examined this morning.  He reports his shortness of breath is much better but he still needing 3 L oxygen.  Gets tachycardic in 150s when coughing.   Denies chest pain mild cough with productive sputum.    Assessment & Plan:  Acute respiratory failure with hypoxia and hypercapnia:  Suspecting acute COPD exacerbation.  Not formally  diagnosed COPD, has extensive bilateral wheezing improving with Solu-Medrol-we will adjust the dose to 60 mg every 8 hours- CONT bronchodilators, started LABA/LAMA-anoro ellipta. He has had abt > 20 years of smoking hx. had some elevated BNP and at home takes as needed Lasix echo with normal EF status post IV Lasix.continue azithromycin.  Has leukocytosis likely in the setting of steroid use but no fever, no pneumonia on the chest x-ray-check procalcitonin.  Serial troponins are negative.  He is on Xarelto.  Encouraged ambulation PT incentive spirometry.  Chronic Atrial fibrillation:  Rate at times uncontrolled during coughing spell.  Continue Xarelto will increase to 240MG  Cardizem CD daily, interestingly he takes twice daily. ADDENDUMpt went into RVR- ekg done- asymptomatic-it is mostly with cough/activity- reusmec cardizem 204 mg- HR coming down in 110s seen at bedside-discussed w/ RN- will keep home dose cardizem 240 mg bid- monitor in tele. Bed rest and antitussives.he is on albuterol change to xoponex.  Essential hypertension:  BP well well controlled, continue his ARBs, po cardizem.  Cigarette smoker-cessation has been counseled  Alcohol abuse/Alcohol-induced polyneuropathy: No signs of withdrawal.  Last EtOH use 3 to 4 days PTA.  Cont thiamine.    Morbid obesity with BMI Body mass index is 32.82 kg/m.  Will benefit with PCP follow-up and weight loss.  Diet Order            Diet Heart Room service appropriate? Yes; Fluid consistency: Thin  Diet effective now                Patient's Body mass index is 31.78 kg/m.  DVT prophylaxis: SCDs Start: 08/31/20 1735 XARELTO Code Status:   Code Status: Full Code  Family Communication:  plan of care discussed with patient at bedside.  Status is: Inpatient Remains inpatient appropriate because:IV treatments appropriate due to intensity of illness or inability to take PO, Inpatient level of care appropriate due to severity of illness and  Ongoing hypoxic respiratory failure   Dispo: The patient is from: Home              Anticipated d/c is to: Home tomorrow if respiratory status is stable may need to go home with oxygen.  Obtain PT eval               Patient currently is not medically stable to d/c.   Difficult to place patient No  Unresulted Labs (From admission, onward)          Start     Ordered   09/02/20 0500  Basic metabolic panel  Daily,   R     Question:  Specimen collection method  Answer:  Lab=Lab collect   09/01/20 1011   09/01/20 1010  CBC  Daily,   R      09/01/20 1008        Medications reviewed:  Scheduled Meds: . albuterol  2.5 mg Nebulization Q6H  . azithromycin  500 mg Oral Daily  . diltiazem  30 mg Oral Q8H  . DULoxetine  30 mg Oral Daily  . gabapentin  600 mg Oral TID  . irbesartan  150 mg Oral Daily  . methylPREDNISolone (SOLU-MEDROL) injection  40 mg Intravenous Q6H   Followed by  . [START ON 09/03/2020] predniSONE  40 mg Oral Q breakfast  . nicotine  21 mg Transdermal Daily  . rivaroxaban  20 mg Oral Q supper  . thiamine  100 mg Oral Daily  . umeclidinium-vilanterol  1 puff Inhalation Daily  . vitamin B-12  1,000 mcg Oral Daily   Continuous Infusions:  Consultants:see note  Procedures:see note  Antimicrobials: Anti-infectives (From admission, onward)   Start     Dose/Rate Route Frequency Ordered Stop   09/01/20 1000  azithromycin (ZITHROMAX) tablet 500 mg        500 mg Oral Daily 08/31/20 1745     08/31/20 1100  azithromycin (ZITHROMAX) 500 mg in sodium chloride 0.9 % 250 mL IVPB        500 mg 250 mL/hr over 60 Minutes Intravenous  Once 08/31/20 1050 08/31/20 1256     Culture/Microbiology    Component Value Date/Time   SDES ABSCESS RIGHT FINGER INDEX 08/17/2007 1738   SPECREQUEST NONE 08/17/2007 1738   CULT  08/17/2007 1738    ABUNDANT METHICILLIN RESISTANT STAPHYLOCOCCUS AUREUS Note: RIFAMPIN AND GENTAMICIN SHOULD NOT BE USED AS SINGLE DRUGS FOR TREATMENT OF STAPH  INFECTIONS. This organism DOES NOT demonstrate inducible Clindamycin resistance in vitro. CRITICAL RESULT CALLED TO, READ BACK BY AND VERIFIED WITH: BREAD 08/20/07 AT  830 AM BY [facility]   REPTSTATUS 08/20/2007 FINAL 08/17/2007 1738    Other culture-see note  Objective: Vitals: Today's Vitals   09/01/20 2054 09/01/20 2100 09/02/20 0500 09/02/20 0931  BP: (!) 152/86  (!) 142/94   Pulse: 89  78   Resp: 18  18   Temp: 97.8 F (36.6 C)  98.4 F (36.9 C)   TempSrc: Oral  Oral   SpO2: 91%  97%   Weight:   106.3 kg   Height:      PainSc:  0-No pain  0-No pain    Intake/Output Summary (Last 24 hours) at 09/02/2020 1037 Last data filed at 09/01/2020 2056 Gross  per 24 hour  Intake -  Output 1775 ml  Net -1775 ml   Filed Weights   08/31/20 0904 09/01/20 0500 09/02/20 0500  Weight: 109.8 kg 106.5 kg 106.3 kg   Weight change: -3.471 kg  Intake/Output from previous day: 03/16 0701 - 03/17 0700 In: 240 [P.O.:240] Out: 1775 [Urine:1775] Intake/Output this shift: No intake/output data recorded. Filed Weights   08/31/20 0904 09/01/20 0500 09/02/20 0500  Weight: 109.8 kg 106.5 kg 106.3 kg    Examination:  General exam: AAOx3, obese,NAD, weak appearing. HEENT:Oral mucosa moist, Ear/Nose WNL grossly, dentition normal. Respiratory system: bilaterally urine present with expiratory wheezing which is improving from before,no use of accessory muscle Cardiovascular system: S1 & S2 +, No JVD,. Gastrointestinal system: Abdomen soft, NT,ND, BS+ Nervous System:Alert, awake, moving extremities and grossly nonfocal Extremities: No edema, distal peripheral pulses palpable.  Skin: No rashes,no icterus. MSK: Normal muscle bulk,tone, power  Data Reviewed: I have personally reviewed following labs and imaging studies CBC: Recent Labs  Lab 08/31/20 0910 08/31/20 1003 09/01/20 1120 09/02/20 0414  WBC 14.7*  --  16.7* 17.3*  NEUTROABS 11.2*  --   --   --   HGB 14.3 14.3 16.2 15.9  HCT 42.0  42.0 48.8 48.4  MCV 101.7*  --  103.4* 104.8*  PLT 233  --  282 302   Basic Metabolic Panel: Recent Labs  Lab 08/31/20 0910 08/31/20 1003 09/01/20 0417 09/02/20 0414  NA 136 137 140 140  K 3.1* 3.3* 3.9 4.1  CL 91*  --  99 97*  CO2 33*  --  27 31  GLUCOSE 162*  --  184* 174*  BUN 13  --  14 28*  CREATININE 0.99  --  0.85 0.99  CALCIUM 8.6*  --  9.0 9.1   GFR: Estimated Creatinine Clearance: 112.5 mL/min (by C-G formula based on SCr of 0.99 mg/dL). Liver Function Tests: Recent Labs  Lab 08/31/20 0910 09/01/20 0417  AST 33 26  ALT 24 24  ALKPHOS 93 90  BILITOT 0.9 0.6  PROT 7.7 7.1  ALBUMIN 3.3* 3.0*   No results for input(s): LIPASE, AMYLASE in the last 168 hours. No results for input(s): AMMONIA in the last 168 hours. Coagulation Profile: No results for input(s): INR, PROTIME in the last 168 hours. Cardiac Enzymes: No results for input(s): CKTOTAL, CKMB, CKMBINDEX, TROPONINI in the last 168 hours. BNP (last 3 results) No results for input(s): PROBNP in the last 8760 hours. HbA1C: No results for input(s): HGBA1C in the last 72 hours. CBG: No results for input(s): GLUCAP in the last 168 hours. Lipid Profile: No results for input(s): CHOL, HDL, LDLCALC, TRIG, CHOLHDL, LDLDIRECT in the last 72 hours. Thyroid Function Tests: No results for input(s): TSH, T4TOTAL, FREET4, T3FREE, THYROIDAB in the last 72 hours. Anemia Panel: No results for input(s): VITAMINB12, FOLATE, FERRITIN, TIBC, IRON, RETICCTPCT in the last 72 hours. Sepsis Labs: No results for input(s): PROCALCITON, LATICACIDVEN in the last 168 hours.  Recent Results (from the past 240 hour(s))  Resp Panel by RT-PCR (Flu A&B, Covid) Nasopharyngeal Swab     Status: None   Collection Time: 08/31/20  9:10 AM   Specimen: Nasopharyngeal Swab; Nasopharyngeal(NP) swabs in vial transport medium  Result Value Ref Range Status   SARS Coronavirus 2 by RT PCR NEGATIVE NEGATIVE Final    Comment: (NOTE) SARS-CoV-2  target nucleic acids are NOT DETECTED.  The SARS-CoV-2 RNA is generally detectable in upper respiratory specimens during the acute phase of infection.  The lowest concentration of SARS-CoV-2 viral copies this assay can detect is 138 copies/mL. A negative result does not preclude SARS-Cov-2 infection and should not be used as the sole basis for treatment or other patient management decisions. A negative result may occur with  improper specimen collection/handling, submission of specimen other than nasopharyngeal swab, presence of viral mutation(s) within the areas targeted by this assay, and inadequate number of viral copies(<138 copies/mL). A negative result must be combined with clinical observations, patient history, and epidemiological information. The expected result is Negative.  Fact Sheet for Patients:  BloggerCourse.com  Fact Sheet for Healthcare Providers:  SeriousBroker.it  This test is no t yet approved or cleared by the [place] FDA and  has been authorized for detection and/or diagnosis of SARS-CoV-2 by FDA under an Emergency Use Authorization (EUA). This EUA will remain  in effect (meaning this test can be used) for the duration of the COVID-19 declaration under Section 564(b)(1) of the Act, 21 U.S.C.section 360bbb-3(b)(1), unless the authorization is terminated  or revoked sooner.       Influenza A by PCR NEGATIVE NEGATIVE Final   Influenza B by PCR NEGATIVE NEGATIVE Final    Comment: (NOTE) The Xpert Xpress SARS-CoV-2/FLU/RSV plus assay is intended as an aid in the diagnosis of influenza from Nasopharyngeal swab specimens and should not be used as a sole basis for treatment. Nasal washings and aspirates are unacceptable for Xpert Xpress SARS-CoV-2/FLU/RSV testing.  Fact Sheet for Patients: BloggerCourse.com  Fact Sheet for Healthcare  Providers: SeriousBroker.it  This test is not yet approved or cleared by the [place] FDA and has been authorized for detection and/or diagnosis of SARS-CoV-2 by FDA under an Emergency Use Authorization (EUA). This EUA will remain in effect (meaning this test can be used) for the duration of the COVID-19 declaration under Section 564(b)(1) of the Act, 21 U.S.C. section 360bbb-3(b)(1), unless the authorization is terminated or revoked.  Performed at [facility], [address] [place], [place] [address]      Radiology Studies: ECHOCARDIOGRAM COMPLETE  Result Date: 09/01/2020    ECHOCARDIOGRAM REPORT   Patient Name:   ASIM GERSTEN Date of Exam: 09/01/2020 Medical Rec #:  [id number]       Height:       72.0 in Accession #:    [id number]      Weight:       234.8 lb Date of Birth:  [birth year]       BSA:          2.281 m Patient Age:    49 years        BP:           130/63 mmHg Patient Gender: M               HR:           101 bpm. Exam Location:  Inpatient Procedure: 2D Echo Indications:    CHF- Acute Diastolic I50.31  History:        Patient has prior history of Echocardiogram examinations, most                 recent 12/19/2019. COPD, Arrythmias:Atrial Fibrillation; Risk                 Factors:Hypertension.  Sonographer:    Thurman Coyer RDCS (AE) Referring Phys: [id number] RAMESH KC IMPRESSIONS  1. Left ventricular ejection fraction, by estimation, is 55 to 60%. The left ventricle has normal function.  The left ventricle has no regional wall motion abnormalities. Left ventricular diastolic function could not be evaluated.  2. Right ventricular systolic function is normal. The right ventricular size is normal. Tricuspid regurgitation signal is inadequate for assessing PA pressure.  3. Left atrial size was mildly dilated.  4. The mitral valve is grossly normal. Trivial mitral valve regurgitation. No evidence of mitral stenosis.  5. The aortic valve is  tricuspid. Aortic valve regurgitation is not visualized. No aortic stenosis is present. Comparison(s): No significant change from prior study. FINDINGS  Left Ventricle: Left ventricular ejection fraction, by estimation, is 55 to 60%. The left ventricle has normal function. The left ventricle has no regional wall motion abnormalities. The left ventricular internal cavity size was normal in size. There is  no left ventricular hypertrophy. Left ventricular diastolic function could not be evaluated due to atrial fibrillation. Left ventricular diastolic function could not be evaluated. Right Ventricle: The right ventricular size is normal. No increase in right ventricular wall thickness. Right ventricular systolic function is normal. Tricuspid regurgitation signal is inadequate for assessing PA pressure. Left Atrium: Left atrial size was mildly dilated. Right Atrium: Right atrial size was normal in size. Pericardium: Trivial pericardial effusion is present. Presence of pericardial fat pad. Mitral Valve: The mitral valve is grossly normal. Trivial mitral valve regurgitation. No evidence of mitral valve stenosis. Tricuspid Valve: The tricuspid valve is grossly normal. Tricuspid valve regurgitation is trivial. No evidence of tricuspid stenosis. Aortic Valve: The aortic valve is tricuspid. Aortic valve regurgitation is not visualized. No aortic stenosis is present. Pulmonic Valve: The pulmonic valve was grossly normal. Pulmonic valve regurgitation is not visualized. No evidence of pulmonic stenosis. Aorta: The aortic root is normal in size and structure. Venous: The inferior vena cava was not well visualized. IAS/Shunts: The atrial septum is grossly normal. EKG: Rhythm strip during this exam demostrated atrial fibrillation.  LEFT VENTRICLE PLAX 2D LVIDd:         5.40 cm LVIDs:         4.40 cm LV PW:         0.90 cm LV IVS:        1.10 cm LVOT diam:     2.40 cm LV SV:         77 LV SV Index:   34 LVOT Area:     4.52 cm  LEFT  ATRIUM              Index       RIGHT ATRIUM           Index LA diam:        4.20 cm  1.84 cm/m  RA Area:     23.80 cm LA Vol (A2C):   105.0 ml 46.04 ml/m RA Volume:   71.20 ml  31.22 ml/m LA Vol (A4C):   88.5 ml  38.80 ml/m LA Biplane Vol: 96.2 ml  42.18 ml/m  AORTIC VALVE LVOT Vmax:   81.25 cm/s LVOT Vmean:  59.850 cm/s LVOT VTI:    0.170 m  AORTA Ao Root diam: 3.60 cm  SHUNTS Systemic VTI:  0.17 m Systemic Diam: 2.40 cm Lennie Odor MD Electronically signed by Lennie Odor MD Signature Date/Time: 09/01/2020/4:00:46 PM    Final      LOS: 2 days   Lanae Boast, MD [facility]  09/02/2020, 10:37 AM

## 2020-09-02 NOTE — Progress Notes (Signed)
Clinical swallow evaluation completed. Full report to follow.  Pt with no indications of oropharyngeal dysphagia nor s/s of aspiration.  He easily passed 3 ounce Yale water challenge. Pt is missing a few dentition but reports he can masticate adequately.  Observed pt consuming saltine crackers, applesauce and thin water.  No indication of dysphagia noted and pt stated "Now I can tell that would go down fine from the first swallow". He denies any modifications that improve or worsen his dysphagia symptoms.    Pt endorses sensing food sticking in the throat and consuming liquids to clear, without consistent success.  At times, he regurgitates food/drink in small quantities - accompanied by frothy secretions- most notably if he consumes po too quickly.    Also endorses waking in the middle of the night coughing and expectorating secretions.    Pt admits to h/o gastroesophageal reflux and during time of evaluation was reporting "burning" - it was approx one hour after eating fruit.   His dysphagia symptoms appear consistent with esophageal deficits that may be coorelated to his ETOH and tobacco use - ? Esophagitis, ? GERD?   SLP educated him to GERD precautions, mitigation strategies as well as esophageal precautions.  Advised him to speak to MD regarding his reflux issues - using teach back pt educated. Advised him to abstain from ETOH and tobacco and will likely see significant improvement with swallowing.  If pt swallowing does not improve, he may benefit from follow up as an OP with GI and/or ENT.    No SLP follow up indicated following filing of report.   Rolena Infante, MS [facility] SLP Acute Rehab Services Office [phone] Pager [phone]

## 2020-09-03 LAB — BASIC METABOLIC PANEL
Anion gap: 11 (ref 5–15)
BUN: 31 mg/dL — ABNORMAL HIGH (ref 6–20)
CO2: 32 mmol/L (ref 22–32)
Calcium: 8.7 mg/dL — ABNORMAL LOW (ref 8.9–10.3)
Chloride: 97 mmol/L — ABNORMAL LOW (ref 98–111)
Creatinine, Ser: 1.06 mg/dL (ref 0.61–1.24)
GFR, Estimated: >60 mL/min (ref >60)
Glucose, Bld: 216 mg/dL — ABNORMAL HIGH (ref 70–99)
Potassium: 4.5 mmol/L (ref 3.5–5.1)
Sodium: 140 mmol/L (ref 135–145)

## 2020-09-03 LAB — PROCALCITONIN: Procalcitonin: <0.1 ng/mL

## 2020-09-03 LAB — CBC
HCT: 48.6 % (ref 39.0–52.0)
Hemoglobin: 16 g/dL (ref 13.0–17.0)
MCH: 34.1 pg — ABNORMAL HIGH (ref 26.0–34.0)
MCHC: 32.9 g/dL (ref 30.0–36.0)
MCV: 103.6 fL — ABNORMAL HIGH (ref 80.0–100.0)
Platelets: 296 10*3/uL (ref 150–400)
RBC: 4.69 MIL/uL (ref 4.22–5.81)
RDW: 12.3 % (ref 11.5–15.5)
WBC: 15.7 10*3/uL — ABNORMAL HIGH (ref 4.0–10.5)
nRBC: 0 % (ref 0.0–0.2)

## 2020-09-03 MED ORDER — PREDNISONE 20 MG PO TABS: 40.0000 mg | ORAL_TABLET | Freq: Every day | ORAL | Status: DC

## 2020-09-03 MED ORDER — PREDNISONE 20 MG PO TABS
40.0000 mg | ORAL_TABLET | Freq: Every day | ORAL | Status: DC
  Administered 2020-09-03 – 2020-09-04 (×2): 40 mg via ORAL
  Filled 2020-09-03 (×2): qty 2

## 2020-09-03 MED ORDER — FUROSEMIDE 20 MG PO TABS
20.0000 mg | ORAL_TABLET | Freq: Every day | ORAL | Status: DC
  Administered 2020-09-04: 20 mg via ORAL
  Filled 2020-09-03: qty 1

## 2020-09-03 MED ORDER — DIPHENHYDRAMINE HCL 25 MG PO CAPS
25.0000 mg | ORAL_CAPSULE | Freq: Once | ORAL | Status: AC
  Administered 2020-09-03: 25 mg via ORAL
  Filled 2020-09-03: qty 1

## 2020-09-03 MED ORDER — FUROSEMIDE 10 MG/ML IJ SOLN
20.0000 mg | Freq: Once | INTRAMUSCULAR | Status: AC
  Administered 2020-09-03: 20 mg via INTRAVENOUS
  Filled 2020-09-03: qty 2

## 2020-09-03 NOTE — TOC Progression Note (Signed)
Transition of Care [facility]) - Progression Note    Patient Details  Name: CHANCELOR HARDRICK MRN: [id number] Date of Birth: 11-Sep-1970  Transition of [facility] CM/SW Contact  Geni Bers, RN Phone Number: 09/03/2020, 2:38 PM  Clinical Narrative:    Pt asked to speak with Financial Advisor concerning his bill and application for Medicaid. Pt asked that CM speak to [name] pt's girlfriend. Explained that pt will need to speak with FA, telephone call was made and left VM for FA to call pt.         Expected Discharge Plan and Services                                                 Social Determinants of Health (SDOH) Interventions    Readmission Risk Interventions No flowsheet data found.

## 2020-09-03 NOTE — Evaluation (Signed)
Clinical/Bedside Swallow Evaluation Patient Details  Name: Kevin Duncan MRN: [id number] Date of Birth: March 26, 1971  Today's Date: 09/03/2020 Time: SLP Start Time (ACUTE ONLY): 1750 SLP Stop Time (ACUTE ONLY): 1825 SLP Time Calculation (min) (ACUTE ONLY): 35 min  Past Medical History:  Past Medical History:  Diagnosis Date  . Atrial fibrillation (HCC)   . COPD (chronic obstructive pulmonary disease) (HCC)   . Hypertension   . Prediabetes    Past Surgical History:  Past Surgical History:  Procedure Laterality Date  . 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 He endorses weight gain about  20 lb in 3 to 4 months, on and off leg swelling and sob worse on laying flat and during night. CXR negative.  Swallow evaluation was ordered. .   Assessment / Plan / Recommendation Clinical Impression  Pt with no indications of oropharyngeal dysphagia nor s/s of aspiration.  He easily passed 3 ounce [place] water challenge. Pt is missing a few dentition but reports he can masticate adequately.  Observed pt consuming saltine crackers, applesauce and thin water.  No indication of dysphagia noted and pt stated "Now I can tell that would go down fine from the first swallow". He denies any modifications that improve or worsen his dysphagia symptoms.       Pt endorses sensing food sticking in the throat and consuming liquids to clear, without consistent success.  At times, he regurgitates food/drink in small quantities - accompanied by frothy secretions- most notably if he consumes po too quickly.    Also endorses waking in the middle of the night coughing and expectorating secretions.       Pt  admits to h/o gastroesophageal reflux and during time of evaluation was reporting "burning" - it was approx one hour after eating fruit.   His dysphagia symptoms appear consistent with esophageal deficits that may be coorelated to his ETOH and tobacco use - ? Esophagitis, ? GERD?   SLP educated him to GERD precautions, mitigation strategies as well as esophageal precautions.  Advised him to speak to MD regarding his reflux issues - using teach back pt educated. Advised him to abstain from ETOH and tobacco and will likely see significant improvement with swallowing.  If pt swallowing does not improve, he may benefit from follow up as an OP with GI and/or ENT. SLP Visit Diagnosis: Dysphagia, unspecified (R13.10)    Aspiration Risk  Mild aspiration risk    Diet Recommendation Regular;Thin liquid   Liquid Administration via: Cup Medication Administration: Whole meds with liquid Supervision: Patient able to self feed Compensations: Minimize environmental distractions;Small sips/bites;Slow rate Postural Changes: Seated upright at 90 degrees;Remain upright for at least 30 minutes after po intake    Other  Recommendations Oral Care Recommendations: Oral care BID   Follow up Recommendations None      Frequency and Duration   n/a         Prognosis   n/a     Swallow Study   General Date of Onset: 09/03/20 HPI: Pt is a 50 yo male with h/o copd,  Afib on chronic Xarelto/diltiazem 120 mg, alcohol abuse, 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 He endorses weight gain about  20 lb in 3 to 4 months, on and off leg swelling and sob worse on laying flat and during night. CXR negative.  Swallow evaluation was ordered. . Type of Study: Bedside Swallow Evaluation Diet Prior to this Study: Regular;Thin  liquids Temperature Spikes Noted: No Respiratory Status: Nasal cannula History of Recent Intubation: No Behavior/Cognition: Alert;Cooperative;Pleasant mood Oral Cavity Assessment: Within Functional Limits Oral Care Completed by SLP: No Oral Cavity - Dentition: Adequate natural dentition Vision: Functional for self-feeding Self-Feeding Abilities: Able to feed self Patient Positioning: Upright in bed Baseline Vocal Quality: Normal Volitional Cough: Strong Volitional Swallow: Able to elicit    Oral/Motor/Sensory Function Overall Oral Motor/Sensory Function: Within functional limits   Ice Chips Ice chips: Not tested   Thin Liquid Thin Liquid: Within functional limits Presentation: Cup    Nectar Thick Nectar Thick Liquid: Not tested   Honey Thick Honey Thick Liquid: Not tested   Puree Puree: Within functional limits Presentation: Self Fed;Spoon   Solid     Solid: Within functional limits Presentation: Self Orvan July 09/03/2020,1:53 AM Signed for 09/02/2020 evaluation  Rolena Infante, MS [facility] SLP Acute Rehab Services Office [phone] Pager [phone]

## 2020-09-03 NOTE — Progress Notes (Signed)
PROGRESS NOTE    Kevin Duncan  WTU:[id number] DOB: [birth year] DOA: 08/31/2020 PCP: Storm Frisk, MD   Chief Complaint  Patient presents with  . Shortness of Breath  Brief Narrative: 50 y.o. male with medical history significant for A. 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 He endorses weight gain about  20 lb in 3 to 4 months, on and off leg swelling and sob worse on laying flat and during night.  ED Course: Was tachypneic saturating 85% room air needing 4l [place] to maintain his pulse ox to 96%, exam with diffuse expiratory wheezing, EKG rate controlled A. Fib, lab with elevated BNP troponin negative chest x-ray cardiomegaly. abg- pco2 50, po2 56, ph 7,4, hypokalemia 3.3, leukocytosis 14.7K.  COVID-19 negative, patient was given nicotine, Zithromax, bronchodilators, Solu-Medrol 125 mg, and admission was requested for acute respiratory failure with hypoxia and COPD exacerbation On my eval he is aaox3, he reports he feels 80% better after nebs and steroid earlier in the ED. He is currently wheezing. He has had no chest pain.Patient otherwise denies any nausea, vomiting,fever, chills, headache, focal weakness, numbness tingling, speech difficulties Patient admitted for COPD exacerbation with extensive bilateral wheezing, and need for supplemental oxygen.  Subjective: Seen this morning patient reports he feels overall much better wheezing is diminished. Oxygen was weaned down to 1 L. Somewhat more short of breath on laying flat orthopneic.  Assessment & Plan:  Acute respiratory failure with hypoxia and hypercapnia:  Suspecting acute COPD exacerbation.  Not formally diagnosed w/ COPD, but longstanding more than  20 years smoking history and significant wheezing on admission wheezing much better with IV steroid, Anoro Ellipta, bronchodilators.  We will transition to oral prednisone in am. He is somewhat orthopneic, normally on Lasix 20 mg daily will give lasix 20 mg x1 and resume home Lasix from tomorrow, has had elevated BNP 200-400 but Echo with normal EF.has had leukocytosis but no pneumonia on chest x-ray, procalcitonin normal and WBC downtrending.  Continue azithromycin for COPD.  Encourage ambulation this afternoon Recent Labs  Lab 08/31/20 0910 09/01/20 1120 09/02/20 0414 09/03/20 0411  WBC 14.7* 16.7* 17.3* 15.7*  PROCALCITON  --   --  <0.10 <0.10   Chronic Atrial fibrillation with RVR: Rate poorly controlled in the setting of COPD exacerbation, bronchodilators.  Now better controlled after resuming home dose coreg 240 mg twice daily.Also continue his Xarelto.  Ambulate and check his heart rate today  Essential hypertension: Stable continue Cardizem and his ARBs  Cigarette smoker-cessation has been counseled. Smoker's cough chronic-as needed antitussives  Alcohol abuse/Alcohol-induced polyneuropathy: No signs of withdrawal.  Last EtOH use 3 to 4 days PTA.  Cont thiamine.    Morbid obesity with BMI Body mass index is 32.82 kg/m.  Will benefit with PCP follow-up and weight loss. He will need pulmonary follow-up for sleep apnea evaluation given complaint of hypercapnia.  Diet Order            Diet Heart Room service appropriate? Yes; Fluid consistency: Thin  Diet effective now                Patient's Body mass index is 31.6 kg/m.  DVT prophylaxis: SCDs Start: 08/31/20 1735 XARELTO Code Status:   Code Status: Full Code  Family Communication: plan of care discussed  with patient at bedside.  Status is: Inpatient Remains inpatient appropriate because:IV treatments appropriate due to intensity of illness or inability to take PO, Inpatient level of care appropriate due to severity of  illness and Ongoing hypoxic respiratory failure   Dispo: The patient is from: Home              Anticipated d/c is to: Home tomorrow if stable.                Patient currently is not medically stable to d/c.   Difficult to place patient No  Unresulted Labs (From admission, onward)          Start     Ordered   09/03/20 0500  Procalcitonin  Daily,   R      09/02/20 1045   09/02/20 0500  Basic metabolic panel  Daily,   R     Question:  Specimen collection method  Answer:  Lab=Lab collect   09/01/20 1011        Medications reviewed:  Scheduled Meds: . azithromycin  500 mg Oral Daily  . diltiazem  240 mg Oral BID  . DULoxetine  30 mg Oral Daily  . furosemide  20 mg Intravenous Once  . [START ON 09/04/2020] furosemide  20 mg Oral Daily  . gabapentin  600 mg Oral TID  . irbesartan  150 mg Oral Daily  . levalbuterol  0.63 mg Nebulization TID  . methylPREDNISolone (SOLU-MEDROL) injection  60 mg Intravenous Q8H  . nicotine  21 mg Transdermal Daily  . rivaroxaban  20 mg Oral Q supper  . thiamine  100 mg Oral Daily  . umeclidinium-vilanterol  1 puff Inhalation Daily  . vitamin B-12  1,000 mcg Oral Daily   Continuous Infusions:  Consultants:see note  Procedures:see note  Antimicrobials: Anti-infectives (From admission, onward)   Start     Dose/Rate Route Frequency Ordered Stop   09/01/20 1000  azithromycin (ZITHROMAX) tablet 500 mg        500 mg Oral Daily 08/31/20 1745     08/31/20 1100  azithromycin (ZITHROMAX) 500 mg in sodium chloride 0.9 % 250 mL IVPB        500 mg 250 mL/hr over 60 Minutes Intravenous  Once 08/31/20 1050 08/31/20 1256     Culture/Microbiology    Component Value Date/Time   SDES ABSCESS RIGHT FINGER INDEX 08/17/2007 1738   SPECREQUEST NONE 08/17/2007 1738   CULT  08/17/2007 1738    ABUNDANT METHICILLIN RESISTANT STAPHYLOCOCCUS AUREUS Note: RIFAMPIN AND GENTAMICIN SHOULD NOT BE USED AS SINGLE DRUGS FOR TREATMENT OF STAPH INFECTIONS. This organism  DOES NOT demonstrate inducible Clindamycin resistance in vitro. CRITICAL RESULT CALLED TO, READ BACK BY AND VERIFIED WITH: BREAD 08/20/07 AT  830 AM BY [facility]   REPTSTATUS 08/20/2007 FINAL 08/17/2007 1738    Other culture-see note  Objective: Vitals: Today's Vitals   09/03/20 0758 09/03/20 0840 09/03/20 1034 09/03/20 1228  BP:   129/77 102/71  Pulse:   99 86  Resp:    18  Temp:    98 F (36.7 C)  TempSrc:    Oral  SpO2: 94%  97% 94%  Weight:      Height:      PainSc:  0-No pain      Intake/Output Summary (Last 24 hours) at 09/03/2020 1340 Last data filed at 09/03/2020 1229 Gross per 24 hour  Intake 480 ml  Output 850 ml  Net -370 ml   [employer]  09/01/20 0500 09/02/20 0500 09/03/20 0350  Weight: 106.5 kg 106.3 kg 105.7 kg   Weight change: -0.612 kg  Intake/Output from previous day: 03/17 0701 - 03/18 0700 In: 120 [P.O.:120] Out: 650 [Urine:650] Intake/Output this shift: Total I/O In: 480 [P.O.:480] Out: 500 [Urine:500] Filed Weights   09/01/20 0500 09/02/20 0500 09/03/20 0350  Weight: 106.5 kg 106.3 kg 105.7 kg    Examination:  General exam: AAOx3 , NAD, weak appearing. HEENT:Oral mucosa moist, Ear/Nose WNL grossly, dentition normal. Respiratory system: bilaterally equal present with significantly improved wheezing,no crackles,no use of accessory muscle Cardiovascular system: S1 & S2 +, No JVD,. Gastrointestinal system: Abdomen soft, NT,ND, BS+ Nervous System:Alert, awake, moving extremities and grossly nonfocal Extremities: No edema, distal peripheral pulses palpable.  Skin: No rashes,no icterus. MSK: Normal muscle bulk,tone, power  Data Reviewed: I have personally reviewed following labs and imaging studies CBC: Recent Labs  Lab 08/31/20 0910 08/31/20 1003 09/01/20 1120 09/02/20 0414 09/03/20 0411  WBC 14.7*  --  16.7* 17.3* 15.7*  NEUTROABS 11.2*  --   --   --   --   HGB 14.3 14.3 16.2 15.9 16.0  HCT 42.0 42.0 48.8 48.4 48.6  MCV 101.7*  --   103.4* 104.8* 103.6*  PLT 233  --  282 302 296   Basic Metabolic Panel: Recent Labs  Lab 08/31/20 0910 08/31/20 1003 09/01/20 0417 09/02/20 0414 09/03/20 0411  NA 136 137 140 140 140  K 3.1* 3.3* 3.9 4.1 4.5  CL 91*  --  99 97* 97*  CO2 33*  --  27 31 32  GLUCOSE 162*  --  184* 174* 216*  BUN 13  --  14 28* 31*  CREATININE 0.99  --  0.85 0.99 1.06  CALCIUM 8.6*  --  9.0 9.1 8.7*   GFR: Estimated Creatinine Clearance: 104.7 mL/min (by C-G formula based on SCr of 1.06 mg/dL). Liver Function Tests: Recent Labs  Lab 08/31/20 0910 09/01/20 0417  AST 33 26  ALT 24 24  ALKPHOS 93 90  BILITOT 0.9 0.6  PROT 7.7 7.1  ALBUMIN 3.3* 3.0*   No results for input(s): LIPASE, AMYLASE in the last 168 hours. No results for input(s): AMMONIA in the last 168 hours. Coagulation Profile: No results for input(s): INR, PROTIME in the last 168 hours. Cardiac Enzymes: No results for input(s): CKTOTAL, CKMB, CKMBINDEX, TROPONINI in the last 168 hours. BNP (last 3 results) No results for input(s): PROBNP in the last 8760 hours. HbA1C: No results for input(s): HGBA1C in the last 72 hours. CBG: No results for input(s): GLUCAP in the last 168 hours. Lipid Profile: No results for input(s): CHOL, HDL, LDLCALC, TRIG, CHOLHDL, LDLDIRECT in the last 72 hours. Thyroid Function Tests: No results for input(s): TSH, T4TOTAL, FREET4, T3FREE, THYROIDAB in the last 72 hours. Anemia Panel: No results for input(s): VITAMINB12, FOLATE, FERRITIN, TIBC, IRON, RETICCTPCT in the last 72 hours. Sepsis Labs: Recent Labs  Lab 09/02/20 0414 09/03/20 0411  PROCALCITON <0.10 <0.10    Recent Results (from the past 240 hour(s))  Resp Panel by RT-PCR (Flu A&B, Covid) Nasopharyngeal Swab     Status: None   Collection Time: 08/31/20  9:10 AM   Specimen: Nasopharyngeal Swab; Nasopharyngeal(NP) swabs in vial transport medium  Result Value Ref Range Status   SARS Coronavirus 2 by RT PCR NEGATIVE NEGATIVE Final     Comment: (NOTE) SARS-CoV-2 target nucleic acids are NOT DETECTED.  The SARS-CoV-2 RNA is generally detectable in upper respiratory specimens during  the acute phase of infection. The lowest concentration of SARS-CoV-2 viral copies this assay can detect is 138 copies/mL. A negative result does not preclude SARS-Cov-2 infection and should not be used as the sole basis for treatment or other patient management decisions. A negative result may occur with  improper specimen collection/handling, submission of specimen other than nasopharyngeal swab, presence of viral mutation(s) within the areas targeted by this assay, and inadequate number of viral copies(<138 copies/mL). A negative result must be combined with clinical observations, patient history, and epidemiological information. The expected result is Negative.  Fact Sheet for Patients:  BloggerCourse.comhttps://www.fda.gov/media/152166/download  Fact Sheet for Healthcare Providers:  SeriousBroker.ithttps://www.fda.gov/media/152162/download  This test is no t yet approved or cleared by the [place] FDA and  has been authorized for detection and/or diagnosis of SARS-CoV-2 by FDA under an Emergency Use Authorization (EUA). This EUA will remain  in effect (meaning this test can be used) for the duration of the COVID-19 declaration under Section 564(b)(1) of the Act, 21 U.S.C.section 360bbb-3(b)(1), unless the authorization is terminated  or revoked sooner.       Influenza A by PCR NEGATIVE NEGATIVE Final   Influenza B by PCR NEGATIVE NEGATIVE Final    Comment: (NOTE) The Xpert Xpress SARS-CoV-2/FLU/RSV plus assay is intended as an aid in the diagnosis of influenza from Nasopharyngeal swab specimens and should not be used as a sole basis for treatment. Nasal washings and aspirates are unacceptable for Xpert Xpress SARS-CoV-2/FLU/RSV testing.  Fact Sheet for Patients: BloggerCourse.comhttps://www.fda.gov/media/152166/download  Fact Sheet for Healthcare  Providers: SeriousBroker.ithttps://www.fda.gov/media/152162/download  This test is not yet approved or cleared by the [place] FDA and has been authorized for detection and/or diagnosis of SARS-CoV-2 by FDA under an Emergency Use Authorization (EUA). This EUA will remain in effect (meaning this test can be used) for the duration of the COVID-19 declaration under Section 564(b)(1) of the Act, 21 U.S.C. section 360bbb-3(b)(1), unless the authorization is terminated or revoked.  Performed at [facility] [place], [address] [place], [place] [address]      Radiology Studies: ECHOCARDIOGRAM COMPLETE  Result Date: 09/01/2020    ECHOCARDIOGRAM REPORT   Patient Name:   Kevin RichardsCALVIN D Duncan Date of Exam: 09/01/2020 Medical Rec #:  [id number]       Height:       72.0 in Accession #:    [id number]      Weight:       234.8 lb Date of Birth:  08/20/1970       BSA:          2.281 m Patient Age:    49 years        BP:           130/63 mmHg Patient Gender: M               HR:           101 bpm. Exam Location:  Inpatient Procedure: 2D Echo Indications:    CHF- Acute Diastolic I50.31  History:        Patient has prior history of Echocardiogram examinations, most                 recent 12/19/2019. COPD, Arrythmias:Atrial Fibrillation; Risk                 Factors:Hypertension.  Sonographer:    Thurman Coyerasey Kirkpatrick RDCS (AE) Referring Phys: [id number] Shon Indelicato IMPRESSIONS  1. Left ventricular ejection fraction, by estimation, is 55 to 60%. The  left ventricle has normal function. The left ventricle has no regional wall motion abnormalities. Left ventricular diastolic function could not be evaluated.  2. Right ventricular systolic function is normal. The right ventricular size is normal. Tricuspid regurgitation signal is inadequate for assessing PA pressure.  3. Left atrial size was mildly dilated.  4. The mitral valve is grossly normal. Trivial mitral valve regurgitation. No evidence of mitral stenosis.  5. The aortic valve is  tricuspid. Aortic valve regurgitation is not visualized. No aortic stenosis is present. Comparison(s): No significant change from prior study. FINDINGS  Left Ventricle: Left ventricular ejection fraction, by estimation, is 55 to 60%. The left ventricle has normal function. The left ventricle has no regional wall motion abnormalities. The left ventricular internal cavity size was normal in size. There is  no left ventricular hypertrophy. Left ventricular diastolic function could not be evaluated due to atrial fibrillation. Left ventricular diastolic function could not be evaluated. Right Ventricle: The right ventricular size is normal. No increase in right ventricular wall thickness. Right ventricular systolic function is normal. Tricuspid regurgitation signal is inadequate for assessing PA pressure. Left Atrium: Left atrial size was mildly dilated. Right Atrium: Right atrial size was normal in size. Pericardium: Trivial pericardial effusion is present. Presence of pericardial fat pad. Mitral Valve: The mitral valve is grossly normal. Trivial mitral valve regurgitation. No evidence of mitral valve stenosis. Tricuspid Valve: The tricuspid valve is grossly normal. Tricuspid valve regurgitation is trivial. No evidence of tricuspid stenosis. Aortic Valve: The aortic valve is tricuspid. Aortic valve regurgitation is not visualized. No aortic stenosis is present. Pulmonic Valve: The pulmonic valve was grossly normal. Pulmonic valve regurgitation is not visualized. No evidence of pulmonic stenosis. Aorta: The aortic root is normal in size and structure. Venous: The inferior vena cava was not well visualized. IAS/Shunts: The atrial septum is grossly normal. EKG: Rhythm strip during this exam demostrated atrial fibrillation.  LEFT VENTRICLE PLAX 2D LVIDd:         5.40 cm LVIDs:         4.40 cm LV PW:         0.90 cm LV IVS:        1.10 cm LVOT diam:     2.40 cm LV SV:         77 LV SV Index:   34 LVOT Area:     4.52 cm  LEFT  ATRIUM              Index       RIGHT ATRIUM           Index LA diam:        4.20 cm  1.84 cm/m  RA Area:     23.80 cm LA Vol (A2C):   105.0 ml 46.04 ml/m RA Volume:   71.20 ml  31.22 ml/m LA Vol (A4C):   88.5 ml  38.80 ml/m LA Biplane Vol: 96.2 ml  42.18 ml/m  AORTIC VALVE LVOT Vmax:   81.25 cm/s LVOT Vmean:  59.850 cm/s LVOT VTI:    0.170 m  AORTA Ao Root diam: 3.60 cm  SHUNTS Systemic VTI:  0.17 m Systemic Diam: 2.40 cm Lennie Odor MD Electronically signed by Lennie Odor MD Signature Date/Time: 09/01/2020/4:00:46 PM    Final      LOS: 3 days   Lanae Boast, MD [facility]  09/03/2020, 1:40 PM

## 2020-09-04 ENCOUNTER — Other Ambulatory Visit (HOSPITAL_COMMUNITY): Payer: Self-pay | Admitting: Internal Medicine

## 2020-09-04 DIAGNOSIS — I10 Essential (primary) hypertension: Secondary | ICD-10-CM

## 2020-09-04 LAB — BASIC METABOLIC PANEL
Anion gap: 12 (ref 5–15)
BUN: 29 mg/dL — ABNORMAL HIGH (ref 6–20)
CO2: 28 mmol/L (ref 22–32)
Calcium: 8.4 mg/dL — ABNORMAL LOW (ref 8.9–10.3)
Chloride: 98 mmol/L (ref 98–111)
Creatinine, Ser: 1.04 mg/dL (ref 0.61–1.24)
GFR, Estimated: >60 mL/min (ref >60)
Glucose, Bld: 174 mg/dL — ABNORMAL HIGH (ref 70–99)
Potassium: 4.8 mmol/L (ref 3.5–5.1)
Sodium: 138 mmol/L (ref 135–145)

## 2020-09-04 MED ORDER — UMECLIDINIUM-VILANTEROL 62.5-25 MCG/INH IN AEPB: 1.0000 | INHALATION_SPRAY | Freq: Every day | RESPIRATORY_TRACT | 0 refills | Status: DC

## 2020-09-04 MED ORDER — ALBUTEROL SULFATE HFA 108 (90 BASE) MCG/ACT IN AERS: 2.0000 | INHALATION_SPRAY | Freq: Four times a day (QID) | RESPIRATORY_TRACT | 0 refills | Status: DC | PRN

## 2020-09-04 MED ORDER — PREDNISONE 10 MG PO TABS: ORAL_TABLET | ORAL | 0 refills | Status: DC

## 2020-09-04 MED ORDER — AZITHROMYCIN 500 MG PO TABS
500.0000 mg | ORAL_TABLET | Freq: Every day | ORAL | 0 refills | Status: DC
Start: 2020-09-04 — End: 2020-09-04

## 2020-09-04 MED ORDER — AZITHROMYCIN 500 MG PO TABS: 500.0000 mg | ORAL_TABLET | Freq: Every day | ORAL | 0 refills | Status: DC

## 2020-09-04 NOTE — TOC Progression Note (Signed)
Transition of Care [facility]) - Progression Note    Patient Details  Name: Kevin Duncan MRN: [id number] Date of Birth: February 20, 1971  Transition of Care [id number]) CM/SW Contact  Armanda Heritage, RN Phone Number: 09/04/2020, 10:22 AM  Clinical Narrative:    CM spoke with patient and girlfriend Debbie regarding medication assistance.  Debbie reports patient normally receives medications from [facility] at a monthly cost of 14$.  unfortunately this pharmacy is closed on the weekend.  CM spoke with bedside nurse who confirmed she gave the patient the inhalers he has been using during this hospital stay.  Patient additionally has scripts for Zithromax and prednisone, these medications are available at [facility] pharmacy at a cost of 10.99 and 4.60. Debbie made aware of this and patient can afford this cost.  MD notified and scripts e-faxed to [facility] on [address]    Expected Discharge Plan: Home/Self Care Barriers to Discharge: No Barriers Identified  Expected Discharge Plan and Services Expected Discharge Plan: Home/Self Care   Discharge Planning Services: CM Consult   Living arrangements for the past 2 months: Single Family Home Expected Discharge Date: 09/04/20                                     Social Determinants of Health (SDOH) Interventions    Readmission Risk Interventions No flowsheet data found.

## 2020-09-04 NOTE — Discharge Summary (Signed)
Physician Discharge Summary  Kevin Duncan FAO:[id number] DOB: 12/07/1970 DOA: 08/31/2020  PCP: Elsie Stain, MD  Admit date: 08/31/2020 Discharge date: 09/04/2020  Admitted From: Home Disposition: Home  Recommendations for Outpatient Follow-up:  1. Follow up with PCP-pulmonologist Dr. [name] next week  Home Health:no  Equipment/Devices: none  Discharge Condition: Stable Code Status:   Code Status: Full Code Diet recommendation:  Diet Order            Diet - low sodium heart healthy           Diet Heart Room service appropriate? Yes; Fluid consistency: Thin  Diet effective now                  Brief/Interim Summary: 50 y.o.malewith medical history significantforA. 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 He endorses weight gain about 20 lb in 3 to 4 months, on and off leg swelling and sob worse on laying flat and during night.  ED Course:Was tachypneic saturating 85% room air needing 4l [place] to maintain his pulse oxto 96%, examwith diffuse expiratory wheezing,EKG rate controlled A. Fib,lab with elevated BNP troponin negative chest x-ray cardiomegaly. abg- pco2 50, po2 56, ph 7,4,hypokalemia 3.3,leukocytosis 14.7K.COVID-19 negative,patient was given nicotine, Zithromax, bronchodilators, Solu-Medrol 125 mg,and admission was requested for acute respiratory failure with hypoxia and COPD exacerbation On my eval he is aaox3, he reports he feels 80% better after nebs and steroid earlier in the ED. He is currently wheezing. He has had no chest pain.Patient otherwise denies any nausea, vomiting,fever, chills, headache, focal weakness, numbness tingling, speech difficulties Patient admitted for  COPD exacerbation with extensive bilateral wheezing, and need for supplemental oxygen. Patient has managed IV steroids antibiotics bronchodilators, LABA LAMA and treated for COPD exacerbation Patient improved significantly subsequently not needing oxygen, at one point he was up to 3-5 L nasal cannula. He is doing well ambulatory and ambulatory pulse ox is stable. He is being discharged home on oral prednisone taper bronchodilators with instruction to follow-up with his PCP Dr. [name] pulmonologist in a week  Discharge Diagnoses:   Acute respiratory failure with hypoxia and hypercapnia: Suspecting acute COPD exacerbation.  Not formally diagnosed w/ COPD, but longstanding more than 20 years smoking history and significant wheezing on admission wheezing much better with IV steroid, Anoro Ellipta, bronchodilators.   Transitioned to oral prednisone-doing well overall.  He will be discharged home on steroid taper. Has had elevated BNP 200-400 but Echo with normal EF.has had leukocytosis but no pneumonia on chest x-ray, procalcitonin normal and WBC downtrending.  Continue azithromycin.  ChronicAtrial fibrillation with RVR: Rate poorly controlled in the setting of COPD exacerbation, bronchodilators.    But improved after resuming home dose Cardizem 240 mg twice daily. Cont his xarelto.  Essential hypertension: Stable continue Cardizem and his ARBs  Cigarette smoker-cessation has been counseled.  He is aware about the need to quit  Smoker's cough chronic-as needed antitussives  Alcohol abuse/Alcohol-induced polyneuropathy: No signs of withdrawal.  Last EtOH use 3 to 4 days PTA.  Counseled to quit.  Morbid obesity with BMIBody mass index is 32.82 kg/m.  Will benefit with PCP follow-up and weight loss. He will need pulmonary follow-up for sleep apnea evaluation given complaint of hypercapnia  Consults:  none  Subjective: Alert awake oriented no more wheezing breathing well.  Some smoker's  cough.  Wife is at the bedside. He feels ready for home today Ambulating without oxygen. Discharge Exam: Vitals:   09/04/20 0423 09/04/20 0814  BP: (!) 150/91   Pulse: 75   Resp: 17   Temp:    SpO2: 96% 95%   General: Pt is alert, awake, not in acute distress Cardiovascular: RRR, S1/S2 +, no rubs, no gallops Respiratory: Mild expiratory wheezing with prolonged expiratory phase air entry present bilaterally, no use of accessory muscle.   Abdominal: Soft, NT, ND, bowel sounds + Extremities: no edema, no cyanosis  Discharge Instructions  Discharge Instructions    Diet - low sodium heart healthy   Complete by: As directed    Discharge instructions   Complete by: As directed    Please call call MD or return to ER for similar or worsening recurring problem that brought you to hospital or if any fever,nausea/vomiting,abdominal pain, uncontrolled pain, chest pain,  shortness of breath or any other alarming symptoms.  Please follow-up with your pulmonologist Dr. [name] in 1 week  Please follow-up your doctor as instructed in a week time and call the office for appointment.  Please avoid alcohol, smoking, or any other illicit substance and maintain healthy habits including taking your regular medications as prescribed.  You were cared for by a hospitalist during your hospital stay. If you have any questions about your discharge medications or the care you received while you were in the hospital after you are discharged, you can call the unit and ask to speak with the hospitalist on call if the hospitalist that took care of you is not available.  Once you are discharged, your primary care physician will handle any further medical issues. Please note that NO REFILLS for any discharge medications will be authorized once you are discharged, as it is imperative that you return to your primary care physician (or establish a relationship with a primary care physician if you do not have one) for  your aftercare needs so that they can reassess your need for medications and monitor your lab values   Increase activity slowly   Complete by: As directed      Allergies as of 09/04/2020      Reactions   Folic Acid Swelling   redness   Hydrocodone Hives   Trazodone And Nefazodone Swelling      Medication List    STOP taking these medications   diltiazem 120 MG 24 hr capsule Commonly known as: DILACOR XR   GOODY HEADACHE PO   traZODone 50 MG tablet Commonly known as: DESYREL     TAKE these medications   albuterol 108 (90 Base) MCG/ACT inhaler Commonly known as: VENTOLIN HFA Inhale 2 puffs into the lungs every 6 (six) hours as needed for wheezing or shortness of breath.   azithromycin 500 MG tablet Commonly known as: ZITHROMAX Take 1 tablet (500 mg total) by mouth daily for 2 days.   diltiazem 120 MG 24 hr capsule Commonly known as: CARDIZEM CD Take 240 mg by mouth 2 (two) times daily.   DULoxetine 30 MG capsule Commonly known as: CYMBALTA Take 1 capsule (30 mg total) by mouth daily.   furosemide 20 MG tablet Commonly known as: LASIX TAKE ONE TABLET DAILY BY MOUTH FOR 3 DAYS THEN AS NEEDED FOR LEG SWELLING What changed: See the new instructions.   gabapentin 300 MG capsule Commonly known as: NEURONTIN TAKE 2 CAPSULES (600 MG TOTAL) BY MOUTH 3 (THREE) TIMES DAILY.  glucose blood test strip Use to check fasting blood sugar daily.   predniSONE 10 MG tablet Commonly known as: DELTASONE Take PO 4 tabs daily x 2 days,3 tabs daily x 2 days,2 tabs daily x 2 days,1 tab daily x 2 days then stop.   rivaroxaban 20 MG Tabs tablet Commonly known as: Xarelto Take 1 tablet (20 mg total) by mouth daily with supper.   True Metrix Meter w/Device Kit 1 kit by Does not apply route daily. Use to check fasting blood sugar daily.   TRUEplus Lancets 28G Misc Use to check fasting blood sugar daily.   umeclidinium-vilanterol 62.5-25 MCG/INH Aepb Commonly known as: ANORO  ELLIPTA Inhale 1 puff into the lungs daily.   valsartan 160 MG tablet Commonly known as: Diovan Take 1 tablet (160 mg total) by mouth daily.   vitamin B-12 1000 MCG tablet Commonly known as: CYANOCOBALAMIN Take 1,000 mcg by mouth daily.       Follow-up Information    Elsie Stain, MD Follow up in 1 week(s).   Specialty: Pulmonary Disease Contact information: [address] [address] [phone]              Allergies  Allergen Reactions  . Folic Acid Swelling    redness  . Hydrocodone Hives  . Trazodone And Nefazodone Swelling    The results of significant diagnostics from this hospitalization (including imaging, microbiology, ancillary and laboratory) are listed below for reference.    Microbiology: Recent Results (from the past 240 hour(s))  Resp Panel by RT-PCR (Flu A&B, Covid) Nasopharyngeal Swab     Status: None   Collection Time: 08/31/20  9:10 AM   Specimen: Nasopharyngeal Swab; Nasopharyngeal(NP) swabs in vial transport medium  Result Value Ref Range Status   SARS Coronavirus 2 by RT PCR NEGATIVE NEGATIVE Final    Comment: (NOTE) SARS-CoV-2 target nucleic acids are NOT DETECTED.  The SARS-CoV-2 RNA is generally detectable in upper respiratory specimens during the acute phase of infection. The lowest concentration of SARS-CoV-2 viral copies this assay can detect is 138 copies/mL. A negative result does not preclude SARS-Cov-2 infection and should not be used as the sole basis for treatment or other patient management decisions. A negative result may occur with  improper specimen collection/handling, submission of specimen other than nasopharyngeal swab, presence of viral mutation(s) within the areas targeted by this assay, and inadequate number of viral copies(<138 copies/mL). A negative result must be combined with clinical observations, patient history, and epidemiological information. The expected result is Negative.  Fact  Sheet for Patients:  EntrepreneurPulse.com.au  Fact Sheet for Healthcare Providers:  IncredibleEmployment.be  This test is no t yet approved or cleared by the [place] FDA and  has been authorized for detection and/or diagnosis of SARS-CoV-2 by FDA under an Emergency Use Authorization (EUA). This EUA will remain  in effect (meaning this test can be used) for the duration of the COVID-19 declaration under Section 564(b)(1) of the Act, 21 U.S.C.section 360bbb-3(b)(1), unless the authorization is terminated  or revoked sooner.       Influenza A by PCR NEGATIVE NEGATIVE Final   Influenza B by PCR NEGATIVE NEGATIVE Final    Comment: (NOTE) The Xpert Xpress SARS-CoV-2/FLU/RSV plus assay is intended as an aid in the diagnosis of influenza from Nasopharyngeal swab specimens and should not be used as a sole basis for treatment. Nasal washings and aspirates are unacceptable for Xpert Xpress SARS-CoV-2/FLU/RSV testing.  Fact Sheet for Patients: EntrepreneurPulse.com.au  Fact  Sheet for Healthcare Providers: IncredibleEmployment.be  This test is not yet approved or cleared by the [place] and has been authorized for detection and/or diagnosis of SARS-CoV-2 by FDA under an Emergency Use Authorization (EUA). This EUA will remain in effect (meaning this test can be used) for the duration of the COVID-19 declaration under Section 564(b)(1) of the Act, 21 U.S.C. section 360bbb-3(b)(1), unless the authorization is terminated or revoked.  Performed at [facility], [address] [place], [place] [address]     Procedures/Studies: DG Chest Portable 1 View  Result Date: 08/31/2020 CLINICAL DATA:  Cough.  Hypoxia. EXAM: PORTABLE CHEST 1 VIEW COMPARISON:  December 18, 2019 FINDINGS: Lungs are clear. Heart is mildly enlarged with pulmonary vascularity normal. No adenopathy. No bone lesions. IMPRESSION:  Mild cardiac enlargement.  No edema or airspace opacity. Electronically Signed   By: Lowella Grip III M.D.   On: 08/31/2020 09:49   ECHOCARDIOGRAM COMPLETE  Result Date: 09/01/2020    ECHOCARDIOGRAM REPORT   Patient Name:   Kevin Duncan Date of Exam: 09/01/2020 Medical Rec #:  [id number]       Height:       72.0 in Accession #:    [id number]      Weight:       234.8 lb Date of Birth:  January 29, 1971       BSA:          2.281 m Patient Age:    73 years        BP:           130/63 mmHg Patient Gender: M               HR:           101 bpm. Exam Location:  Inpatient Procedure: 2D Echo Indications:    CHF- Acute Diastolic I69.62  History:        Patient has prior history of Echocardiogram examinations, most                 recent 12/19/2019. COPD, Arrythmias:Atrial Fibrillation; Risk                 Factors:Hypertension.  Sonographer:    Mikki Santee RDCS (AE) Referring Phys: [id number] [place] IMPRESSIONS  1. Left ventricular ejection fraction, by estimation, is 55 to 60%. The left ventricle has normal function. The left ventricle has no regional wall motion abnormalities. Left ventricular diastolic function could not be evaluated.  2. Right ventricular systolic function is normal. The right ventricular size is normal. Tricuspid regurgitation signal is inadequate for assessing PA pressure.  3. Left atrial size was mildly dilated.  4. The mitral valve is grossly normal. Trivial mitral valve regurgitation. No evidence of mitral stenosis.  5. The aortic valve is tricuspid. Aortic valve regurgitation is not visualized. No aortic stenosis is present. Comparison(s): No significant change from prior study. FINDINGS  Left Ventricle: Left ventricular ejection fraction, by estimation, is 55 to 60%. The left ventricle has normal function. The left ventricle has no regional wall motion abnormalities. The left ventricular internal cavity size was normal in size. There is  no left ventricular hypertrophy. Left ventricular  diastolic function could not be evaluated due to atrial fibrillation. Left ventricular diastolic function could not be evaluated. Right Ventricle: The right ventricular size is normal. No increase in right ventricular wall thickness. Right ventricular systolic function is normal. Tricuspid regurgitation signal is inadequate for assessing PA pressure. Left Atrium:  Left atrial size was mildly dilated. Right Atrium: Right atrial size was normal in size. Pericardium: Trivial pericardial effusion is present. Presence of pericardial fat pad. Mitral Valve: The mitral valve is grossly normal. Trivial mitral valve regurgitation. No evidence of mitral valve stenosis. Tricuspid Valve: The tricuspid valve is grossly normal. Tricuspid valve regurgitation is trivial. No evidence of tricuspid stenosis. Aortic Valve: The aortic valve is tricuspid. Aortic valve regurgitation is not visualized. No aortic stenosis is present. Pulmonic Valve: The pulmonic valve was grossly normal. Pulmonic valve regurgitation is not visualized. No evidence of pulmonic stenosis. Aorta: The aortic root is normal in size and structure. Venous: The inferior vena cava was not well visualized. IAS/Shunts: The atrial septum is grossly normal. EKG: Rhythm strip during this exam demostrated atrial fibrillation.  LEFT VENTRICLE PLAX 2D LVIDd:         5.40 cm LVIDs:         4.40 cm LV PW:         0.90 cm LV IVS:        1.10 cm LVOT diam:     2.40 cm LV SV:         77 LV SV Index:   34 LVOT Area:     4.52 cm  LEFT ATRIUM              Index       RIGHT ATRIUM           Index LA diam:        4.20 cm  1.84 cm/m  RA Area:     23.80 cm LA Vol (A2C):   105.0 ml 46.04 ml/m RA Volume:   71.20 ml  31.22 ml/m LA Vol (A4C):   88.5 ml  38.80 ml/m LA Biplane Vol: 96.2 ml  42.18 ml/m  AORTIC VALVE LVOT Vmax:   81.25 cm/s LVOT Vmean:  59.850 cm/s LVOT VTI:    0.170 m  AORTA Ao Root diam: 3.60 cm  SHUNTS Systemic VTI:  0.17 m Systemic Diam: 2.40 cm Eleonore Chiquito MD  Electronically signed by Eleonore Chiquito MD Signature Date/Time: 09/01/2020/4:00:46 PM    Final     Labs: BNP (last 3 results) Recent Labs    12/18/19 1200 08/31/20 0910 09/01/20 0417  BNP 58.9 232.2* 664.4*   Basic Metabolic Panel: Recent Labs  Lab 08/31/20 0910 08/31/20 1003 09/01/20 0417 09/02/20 0414 09/03/20 0411 09/04/20 0404  NA 136 137 140 140 140 138  K 3.1* 3.3* 3.9 4.1 4.5 4.8  CL 91*  --  99 97* 97* 98  CO2 33*  --  27 31 32 28  GLUCOSE 162*  --  184* 174* 216* 174*  BUN 13  --  14 28* 31* 29*  CREATININE 0.99  --  0.85 0.99 1.06 1.04  CALCIUM 8.6*  --  9.0 9.1 8.7* 8.4*   Liver Function Tests: Recent Labs  Lab 08/31/20 0910 09/01/20 0417  AST 33 26  ALT 24 24  ALKPHOS 93 90  BILITOT 0.9 0.6  PROT 7.7 7.1  ALBUMIN 3.3* 3.0*   No results for input(s): LIPASE, AMYLASE in the last 168 hours. No results for input(s): AMMONIA in the last 168 hours. CBC: Recent Labs  Lab 08/31/20 0910 08/31/20 1003 09/01/20 1120 09/02/20 0414 09/03/20 0411  WBC 14.7*  --  16.7* 17.3* 15.7*  NEUTROABS 11.2*  --   --   --   --   HGB 14.3 14.3 16.2 15.9 16.0  HCT 42.0  42.0 48.8 48.4 48.6  MCV 101.7*  --  103.4* 104.8* 103.6*  PLT 233  --  282 302 296   Cardiac Enzymes: No results for input(s): CKTOTAL, CKMB, CKMBINDEX, TROPONINI in the last 168 hours. BNP: Invalid input(s): POCBNP CBG: No results for input(s): GLUCAP in the last 168 hours. D-Dimer No results for input(s): DDIMER in the last 72 hours. Hgb A1c No results for input(s): HGBA1C in the last 72 hours. Lipid Profile No results for input(s): CHOL, HDL, LDLCALC, TRIG, CHOLHDL, LDLDIRECT in the last 72 hours. Thyroid function studies No results for input(s): TSH, T4TOTAL, T3FREE, THYROIDAB in the last 72 hours.  Invalid input(s): FREET3 Anemia work up No results for input(s): VITAMINB12, FOLATE, FERRITIN, TIBC, IRON, RETICCTPCT in the last 72 hours. Urinalysis No results found for: COLORURINE,  APPEARANCEUR, [place], [place], GLUCOSEU, [place], [place], [place], PROTEINUR, UROBILINOGEN, NITRITE, LEUKOCYTESUR Sepsis Labs Invalid input(s): PROCALCITONIN,  WBC,  LACTICIDVEN Microbiology Recent Results (from the past 240 hour(s))  Resp Panel by RT-PCR (Flu A&B, Covid) Nasopharyngeal Swab     Status: None   Collection Time: 08/31/20  9:10 AM   Specimen: Nasopharyngeal Swab; Nasopharyngeal(NP) swabs in vial transport medium  Result Value Ref Range Status   SARS Coronavirus 2 by RT PCR NEGATIVE NEGATIVE Final    Comment: (NOTE) SARS-CoV-2 target nucleic acids are NOT DETECTED.  The SARS-CoV-2 RNA is generally detectable in upper respiratory specimens during the acute phase of infection. The lowest concentration of SARS-CoV-2 viral copies this assay can detect is 138 copies/mL. A negative result does not preclude SARS-Cov-2 infection and should not be used as the sole basis for treatment or other patient management decisions. A negative result may occur with  improper specimen collection/handling, submission of specimen other than nasopharyngeal swab, presence of viral mutation(s) within the areas targeted by this assay, and inadequate number of viral copies(<138 copies/mL). A negative result must be combined with clinical observations, patient history, and epidemiological information. The expected result is Negative.  Fact Sheet for Patients:  EntrepreneurPulse.com.au  Fact Sheet for Healthcare Providers:  IncredibleEmployment.be  This test is no t yet approved or cleared by the [place] FDA and  has been authorized for detection and/or diagnosis of SARS-CoV-2 by FDA under an Emergency Use Authorization (EUA). This EUA will remain  in effect (meaning this test can be used) for the duration of the COVID-19 declaration under Section 564(b)(1) of the Act, 21 U.S.C.section 360bbb-3(b)(1), unless the authorization is terminated  or  revoked sooner.       Influenza A by PCR NEGATIVE NEGATIVE Final   Influenza B by PCR NEGATIVE NEGATIVE Final    Comment: (NOTE) The Xpert Xpress SARS-CoV-2/FLU/RSV plus assay is intended as an aid in the diagnosis of influenza from Nasopharyngeal swab specimens and should not be used as a sole basis for treatment. Nasal washings and aspirates are unacceptable for Xpert Xpress SARS-CoV-2/FLU/RSV testing.  Fact Sheet for Patients: EntrepreneurPulse.com.au  Fact Sheet for Healthcare Providers: IncredibleEmployment.be  This test is not yet approved or cleared by the [place] FDA and has been authorized for detection and/or diagnosis of SARS-CoV-2 by FDA under an Emergency Use Authorization (EUA). This EUA will remain in effect (meaning this test can be used) for the duration of the COVID-19 declaration under Section 564(b)(1) of the Act, 21 U.S.C. section 360bbb-3(b)(1), unless the authorization is terminated or revoked.  Performed at [facility], [address], [place], [place] [address]      Time coordinating  discharge: 35 minutes  SIGNED: Antonieta Pert, MD  [facility] 09/04/2020, 10:22 AM  If 7PM-7AM, please contact night-coverage [email or web address]

## 2020-09-06 ENCOUNTER — Other Ambulatory Visit: Payer: Self-pay | Admitting: Critical Care Medicine

## 2020-09-06 ENCOUNTER — Telehealth: Payer: Self-pay

## 2020-09-06 DIAGNOSIS — R200 Anesthesia of skin: Secondary | ICD-10-CM

## 2020-09-06 MED FILL — ANORO ELLIPTA 62.5-25 MCG I: 62.5-25 | 30 days supply | Qty: 60 | Fill #0

## 2020-09-06 MED FILL — DULoxetine HCL 30 MG CPEP: 30 | 30 days supply | Qty: 30 | Fill #2

## 2020-09-06 MED FILL — ALBUTEROL SULFATE HFA 108 (: 108 (90 BAS | 25 days supply | Qty: 9 | Fill #0

## 2020-09-06 MED FILL — ?VALSARTAN 160MG TABLET: 160 | 30 days supply | Qty: 30 | Fill #3

## 2020-09-06 MED FILL — ?FUROSEMIDE 20 MG TABLET: 20 | 30 days supply | Qty: 30 | Fill #1

## 2020-09-06 MED FILL — DILTIAZEM 24HR ER 120 MG CA: 120 | 30 days supply | Qty: 120 | Fill #1

## 2020-09-06 MED FILL — XARELTO 20 MG TABLET: 20 | 30 days supply | Qty: 30 | Fill #10

## 2020-09-06 MED FILL — GABAPENTIN 300 MG CAPSULE: 300 | 30 days supply | Qty: 180 | Fill #0

## 2020-09-06 NOTE — Telephone Encounter (Signed)
Transition Care Management Follow-up Telephone Call  Date of discharge and from where: 09/04/2020, [facility]   How have you been since you were released from the hospital? He stated that he feels " outstanding."  He then deferred all questions to his fiance, Gavin Pound.  Any questions or concerns? No  Items Reviewed:  Did the pt receive and understand the discharge instructions provided? Yes   Medications obtained and verified? Yes   -he has all medications, including the new ones,  and his fiance just called in refills to [facility]. He was given his 2 inhalers that he was using in the hospital She has also requested that the orders for his inhalers be transferred to [facility].   Other? No   Any new allergies since your discharge? No   Do you have support at home? Yes , Gavin Pound, his fiance  Home Care and Equipment/Supplies: Were home health services ordered? no If so, what is the name of the agency? n/a  Has the agency set up a time to come to the patient's home? not applicable Were any new equipment or medical supplies ordered?  No What is the name of the medical supply agency? n/a Were you able to get the supplies/equipment? not applicable Do you have any questions related to the use of the equipment or supplies? No   He has a glucometer at home but is not using it.  She said that pre-diabetes was mentioned about a year ago, but they have not heard anything about that since then.   Functional Questionnaire: (I = Independent and D = Dependent) ADLs: independent. Gavin Pound manages his medications.   Follow up appointments reviewed:   PCP Hospital f/u appt confirmed? Yes  - Dr Delford Field, 09/23/2020.  Specialist Hospital f/u appt confirmed? none scheduled    Are transportation arrangements needed? No   If their condition worsens, is the pt aware to call PCP or go to the Emergency Dept.? Yes  Was the patient provided with contact information for the PCP's office or  ED? Yes, they already have the clinic phone number.  Was to pt encouraged to call back with questions or concerns? Yes

## 2020-09-15 ENCOUNTER — Ambulatory Visit: Payer: Self-pay

## 2020-09-15 NOTE — Telephone Encounter (Signed)
Pt.'s fiance reports pt. Has had dizziness x 2 days. Had blurred vision yesterday and complains of being "tired all the time." Checked BP yesterday at a drug store and BP 89/52. Pt. Not available to talk per fiance. "At home probably sleeping." Attempted to call pt. No answer, left message.Fiance requests to have pt. Seen tomorrow. Please advise.  Answer Assessment - Initial Assessment Questions 1. DESCRIPTION: "Describe your dizziness."     Dizziness 2. LIGHTHEADED: "Do you feel lightheaded?" (e.g., somewhat faint, woozy, weak upon standing)     Yes 3. VERTIGO: "Do you feel like either you or the room is spinning or tilting?" (i.e. vertigo)     Yes 4. SEVERITY: "How bad is it?"  "Do you feel like you are going to faint?" "Can you stand and walk?"   - MILD: Feels slightly dizzy, but walking normally.   - MODERATE: Feels very unsteady when walking, but not falling; interferes with normal activities (e.g., school, work) .   - SEVERE: Unable to walk without falling, or requires assistance to walk without falling; feels like passing out now.      Mild 5. ONSET:  "When did the dizziness begin?"     2 days ago 6. AGGRAVATING FACTORS: "Does anything make it worse?" (e.g., standing, change in head position)     Unsure 7. HEART RATE: "Can you tell me your heart rate?" "How many beats in 15 seconds?"  (Note: not all patients can do this)       No 8. CAUSE: "What do you think is causing the dizziness?"     Unsure 9. RECURRENT SYMPTOM: "Have you had dizziness before?" If Yes, ask: "When was the last time?" "What happened that time?"     No 10. OTHER SYMPTOMS: "Do you have any other symptoms?" (e.g., fever, chest pain, vomiting, diarrhea, bleeding)       Burred vision, tired, low BP 11. PREGNANCY: "Is there any chance you are pregnant?" "When was your last menstrual period?"       N/A  Protocols used: DIZZINESS [facility]

## 2020-09-18 ENCOUNTER — Other Ambulatory Visit: Payer: Self-pay

## 2020-09-23 ENCOUNTER — Ambulatory Visit: Payer: Self-pay | Attending: Critical Care Medicine | Admitting: Critical Care Medicine

## 2020-09-23 ENCOUNTER — Other Ambulatory Visit: Payer: Self-pay

## 2020-09-23 ENCOUNTER — Encounter: Payer: Self-pay | Admitting: Critical Care Medicine

## 2020-09-23 DIAGNOSIS — G621 Alcoholic polyneuropathy: Secondary | ICD-10-CM

## 2020-09-23 DIAGNOSIS — J449 Chronic obstructive pulmonary disease, unspecified: Secondary | ICD-10-CM

## 2020-09-23 DIAGNOSIS — Z7901 Long term (current) use of anticoagulants: Secondary | ICD-10-CM

## 2020-09-23 DIAGNOSIS — I4811 Longstanding persistent atrial fibrillation: Secondary | ICD-10-CM

## 2020-09-23 DIAGNOSIS — R062 Wheezing: Secondary | ICD-10-CM

## 2020-09-23 DIAGNOSIS — I10 Essential (primary) hypertension: Secondary | ICD-10-CM

## 2020-09-23 DIAGNOSIS — F17210 Nicotine dependence, cigarettes, uncomplicated: Secondary | ICD-10-CM

## 2020-09-23 DIAGNOSIS — R200 Anesthesia of skin: Secondary | ICD-10-CM

## 2020-09-23 DIAGNOSIS — F1010 Alcohol abuse, uncomplicated: Secondary | ICD-10-CM

## 2020-09-23 DIAGNOSIS — E119 Type 2 diabetes mellitus without complications: Secondary | ICD-10-CM | POA: Insufficient documentation

## 2020-09-23 DIAGNOSIS — R7303 Prediabetes: Secondary | ICD-10-CM

## 2020-09-23 MED ORDER — VALSARTAN 160 MG PO TABS
ORAL_TABLET | Freq: Every day | ORAL | 1 refills | Status: DC
Start: 2020-09-23 — End: 2020-10-26
  Filled 2020-09-23: qty 90, fill #0
  Filled 2020-10-07: qty 30, 30d supply, fill #0

## 2020-09-23 MED ORDER — ALBUTEROL SULFATE HFA 108 (90 BASE) MCG/ACT IN AERS
2.0000 | INHALATION_SPRAY | Freq: Four times a day (QID) | RESPIRATORY_TRACT | 0 refills | Status: DC | PRN
  Filled 2020-09-23: qty 8.5, fill #0
  Filled 2020-10-07: qty 8.5, 25d supply, fill #0

## 2020-09-23 MED ORDER — DILTIAZEM HCL ER COATED BEADS 120 MG PO CP24
240.0000 mg | ORAL_CAPSULE | Freq: Two times a day (BID) | ORAL | 1 refills | Status: DC
  Filled 2020-09-23: qty 240, 60d supply, fill #0
  Filled 2020-10-07: qty 120, 30d supply, fill #0
  Filled 2020-11-02: qty 120, 30d supply, fill #1
  Filled 2020-11-29: qty 120, 30d supply, fill #2

## 2020-09-23 MED ORDER — GABAPENTIN 300 MG PO CAPS
ORAL_CAPSULE | Freq: Three times a day (TID) | ORAL | 0 refills | Status: DC
  Filled 2020-09-23: qty 180, fill #0
  Filled 2020-10-07: qty 180, 30d supply, fill #0

## 2020-09-23 MED ORDER — UMECLIDINIUM-VILANTEROL 62.5-25 MCG/INH IN AEPB
1.0000 | INHALATION_SPRAY | Freq: Every day | RESPIRATORY_TRACT | 1 refills | Status: DC
  Filled 2020-09-23: qty 60, 60d supply, fill #0
  Filled 2020-10-07: qty 60, 30d supply, fill #0

## 2020-09-23 NOTE — Assessment & Plan Note (Signed)
Patient states his heart rate is improved we will bring this patient in for direct exam and follow-up maintain diltiazem as prescribed along with Xarelto Sure patient has cardiology follow-up

## 2020-09-23 NOTE — Progress Notes (Signed)
Subjective:    Patient ID: Kevin Duncan, male    DOB: 1971/05/29, 50 y.o.   MRN: [id number] Virtual Visit via Telephone Note  I connected with [name] on 09/23/20 at 10:00 AM EDT by telephone and verified that I am speaking with the correct person using two identifiers.   Consent:  I discussed the limitations, risks, security and privacy concerns of performing an evaluation and management service by telephone and the availability of in person appointments. I also discussed with the patient that there may be a patient responsible charge related to this service. The patient expressed understanding and agreed to proceed.  Location of patient: Patient's at home  Location of provider: I am in my office  Persons participating in the televisit with the patient.   No one else on the call    History of Present Illness: 50 y.o.M here to est PCP  Former [name] patient CAF, HTN, T2DM  Tobacco, ETOH use. With neuropathy on duloxitene  06/09/2020 Last OV was telehealth OV 01/07/20 with NP [name] The patient comes in today to officially establish for primary care.  At that last visit the patient complained of numbness and tingling the feet and the patient was on the gabapentin 600 mg 3 times daily at that visit duloxetine was added at 30 mg daily and trazodone given for sleep.  Patient had his diltiazem refilled at 2 tablets twice daily 120 mg tablets  Patient has history of longstanding chronic atrial fibrillation rate controlled.  He was to have a DC cardioversion but not pursue this because of insurance.  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  The patient's not on vitamin supplementation other than B12.  He cannot take folic acid due to side effects.  Patient is still smoking a pack a day of cigarettes.  Note on arrival blood pressure is 151/92.  Patient's BMI remains elevated at 33.  Patient denies any edema  in the lower extremities or chest pain.  The patient is yet to have received a Covid vaccine and refuses a flu vaccine at this visit.  He does not have any specific reasons why he is not gotten the Covid vaccine.  The patient does have type 2 diabetes and the last hemoglobin A1c was 6.1 in July the patient is diet controlled only and not on medications  09/23/2020 This is a 50 year old male who is seen post hospital follow-up.  Note the patient was unable to connect a video therefore we proceeded with a phone visit.  Patient is a chronic atrial fibrillation hypertension alcohol abuse active tobacco use.  Note he started drinking again on a binge basis after being sober and developed increased tachycardia increased dyspnea COPD exacerbation and subsequently admitted to the hospital in March.  Below is a copy of the discharge summary.  Admit date: 08/31/2020 Discharge date: 09/04/2020  Admitted From: Home Disposition: Home  Recommendations for Outpatient Follow-up:  1. Follow up with PCP-pulmonologist Dr. [name] next week  Home Health:no  Equipment/Devices: none  Discharge Condition: Stable Code Status:   Code Status: Full Code Diet recommendation:  Diet Order             Diet - low sodium heart healthy           Diet Heart Room service appropriate? Yes; Fluid consistency: Thin  Diet effective now  Brief/Interim Summary: 50 y.o.malewith medical history significantforA. 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 He endorses weight gain about 20 lb in 3 to 4 months, on and off leg swelling and sob worse on laying flat and during night.  ED Course:Was tachypneic saturating  85% room air needing 4l [place] to maintain his pulse oxto 96%, examwith diffuse expiratory wheezing,EKG rate controlled A. Fib,lab with elevated BNP troponin negative chest x-ray cardiomegaly. abg- pco2 50, po2 56, ph 7,4,hypokalemia 3.3,leukocytosis 14.7K.COVID-19 negative,patient was given nicotine, Zithromax, bronchodilators, Solu-Medrol 125 mg,and admission was requested for acute respiratory failure with hypoxia and COPD exacerbation On my eval he is aaox3, he reports he feels 80% better after nebs and steroid earlier in the ED. He is currently wheezing. He has had no chest pain.Patient otherwise denies any nausea, vomiting,fever, chills, headache, focal weakness, numbness tingling, speech difficulties Patient admitted for COPD exacerbation with extensive bilateral wheezing, and need for supplemental oxygen. Patient has managed IV steroids antibiotics bronchodilators, LABA LAMA and treated for COPD exacerbation Patient improved significantly subsequently not needing oxygen, at one point he was up to 3-5 L nasal cannula. He is doing well ambulatory and ambulatory pulse ox is stable. He is being discharged home on oral prednisone taper bronchodilators with instruction to follow-up with his PCP Dr. [name] pulmonologist in a week  Discharge Diagnoses:   Acute respiratory failure with hypoxia and hypercapnia:Suspecting acute COPD exacerbation. Not formally diagnosed w/COPD,but longstanding more than 20 years smoking history and significant wheezing on admission wheezing much better with IV steroid, Anoro Ellipta, bronchodilators.  Transitioned to oral prednisone-doing well overall.  He will be discharged home on steroid taper. Has had elevatedBNP200-400 but Echo with normal EF.has had leukocytosis but no pneumonia on chest x-ray, procalcitonin normal and WBC downtrending. Continue azithromycin.  ChronicAtrial fibrillationwith WGY:KZLD poorly controlled in the setting of COPD  exacerbation, bronchodilators.   But improved after resuming home dose Cardizem 240 mg twice daily. Cont his xarelto.  Essential hypertension:Stable continue Cardizem and his ARBs  Cigarette smoker-cessation has been counseled.  He is aware about the need to quit  Smoker's cough chronic-as needed antitussives  Alcohol abuse/Alcohol-induced polyneuropathy: No signs of withdrawal. Last EtOH use 3 to 4 days PTA.  Counseled to quit.  Morbid obesity with BMIBody mass index is 32.82 kg/m. Will benefit with PCP follow-up and weight loss.He will need pulmonary follow-up for sleep apnea evaluation given complaint of hypercapnia  Below is a transition of care visit from the nurse that occurred in March Toc visit RN     Transition Care Management Follow-up Telephone Call  Date of discharge and from where: 09/04/2020, [facility]       How have you been since you were released from the hospital? He stated that he feels " outstanding."  He then deferred all questions to his fiance, Neoma Laming.  Any questions or concerns? No  Items Reviewed:  Did the pt receive and understand the discharge instructions provided? Yes   Medications obtained and verified? Yes   -he has all medications, including the new ones,  and his fiance just called in refills to [facility]. He was given his 2 inhalers that he was using in the hospital She has also requested that the orders for his inhalers be transferred to [facility].   Other? No  Any new allergies since your discharge? No   Do you have support at home? Yes , Gavin Pound, his fiance  Home Care and Equipment/Supplies: Were home health services ordered? no If so, what is the name of the agency? n/a  Has the agency set up a time to come to the patient's home? not applicable Were any new equipment or medical supplies ordered?  No What is the name of the medical supply agency? n/a Were you able to get the supplies/equipment? not  applicable Do you have any questions related to the use of the equipment or supplies? No   He has a glucometer at home but is not using it.  She said that pre-diabetes was mentioned about a year ago, but they have not heard anything about that since then.   Functional Questionnaire: (I = Independent and D = Dependent) ADLs: independent. Gavin Pound manages his medications.   Follow up appointments reviewed:   PCP Hospital f/u appt confirmed? Yes  - Dr Delford Field, 09/23/2020.  Specialist Hospital f/u appt confirmed? none scheduled    Are transportation arrangements needed? No   If their condition worsens, is the pt aware to call PCP or go to the Emergency Dept.? Yes  Was the patient provided with contact information for the PCP's office or ED? Yes, they already have the clinic phone number.  Was to pt encouraged to call back with questions or concerns? Yes  Patient states his dyspnea is improved he is having less cough less shortness of breath he is no longer drinking alcohol has been sober for 25 days note he has declined a COVID vaccination and he is aware of the risks of it given his history.  He does not have a method of checking his blood pressure at home.  His weight is about the same.  He is due post hospital labs.     Past Medical History:  Diagnosis Date  . Atrial fibrillation (HCC)   . COPD (chronic obstructive pulmonary disease) (HCC)   . Hypertension   . Prediabetes      Family History  Problem Relation Age of Onset  . Diabetes Mother   . Heart disease Father   . Heart disease Brother   . Diabetes Maternal Grandmother   . Heart disease Paternal Grandmother      Social History   Socioeconomic History  . Marital status: Single    Spouse name: Not on file  . Number of children: Not on file  . Years of education: Not on file  . Highest education level: Not on file  Occupational History  . Not on file  Tobacco Use  . Smoking status: Current Every Day Smoker     Packs/day: 1.50    Types: Cigarettes  . Smokeless tobacco: Never Used  . Tobacco comment: 3/4 pack daily  Vaping Use  . Vaping Use: Never used  Substance and Sexual Activity  . Alcohol use: Not Currently    Alcohol/week: 3.0 standard drinks    Types: 3 Standard drinks or equivalent per week    Comment: 5-6 beer daily  . Drug use: No  . Sexual activity: Not on file  Other Topics Concern  . Not on file  Social History Narrative  . Not on file   Social Determinants of Health   Financial Resource Strain: Not on file  Food Insecurity: Not on file  Transportation Needs: Not on file  Physical Activity: Not on file  Stress: Not on file  Social Connections: Not on  file  Intimate Partner Violence: Not on file     Allergies  Allergen Reactions  . Folic Acid Swelling    redness  . Hydrocodone Hives  . Trazodone And Nefazodone Swelling     Outpatient Medications Prior to Visit  Medication Sig Dispense Refill  . Blood Glucose Monitoring Suppl (TRUE METRIX METER) w/Device KIT 1 kit by Does not apply route daily. Use to check fasting blood sugar daily. 1 kit 0  . DULoxetine (CYMBALTA) 30 MG capsule TAKE 1 CAPSULE (30 MG TOTAL) BY MOUTH DAILY. 90 capsule 1  . furosemide (LASIX) 20 MG tablet TAKE 1 TABLET BY MOUTH DAILY FOR 3 DAYS THEN AS NEEDED FOR LEG SWELLING 30 tablet 3  . glucose blood test strip Use to check fasting blood sugar daily. 100 each 2  . rivaroxaban (XARELTO) 20 MG TABS tablet Take 1 tablet (20 mg total) by mouth daily with supper. 30 tablet 11  . TRUEplus Lancets 28G MISC Use to check fasting blood sugar daily. 100 each 2  . vitamin B-12 (CYANOCOBALAMIN) 1000 MCG tablet Take 1,000 mcg by mouth daily.     Marland Kitchen albuterol (VENTOLIN HFA) 108 (90 Base) MCG/ACT inhaler INHALE 2 PUFFS INTO THE LUNGS EVERY 6 (SIX) HOURS AS NEEDED FOR WHEEZING OR SHORTNESS OF BREATH. 8.5 g 0  . diltiazem (CARDIZEM CD) 120 MG 24 hr capsule Take 240 mg by mouth 2 (two) times daily.    Marland Kitchen  diltiazem (CARDIZEM CD) 120 MG 24 hr capsule TAKE 2 CAPSULES (240 MG TOTAL) BY MOUTH 2 (TWO) TIMES DAILY. 360 capsule 0  . diltiazem (CARDIZEM CD) 120 MG 24 hr capsule TAKE 2 CAPSULES (240 MG TOTAL) BY MOUTH 2 (TWO) TIMES DAILY. 360 capsule 0  . diltiazem (CARDIZEM CD) 120 MG 24 hr capsule TAKE 2 CAPSULES (240 MG TOTAL) BY MOUTH 2 (TWO) TIMES DAILY. 120 capsule 3  . gabapentin (NEURONTIN) 300 MG capsule TAKE 2 CAPSULES (600 MG TOTAL) BY MOUTH 3 (THREE) TIMES DAILY. 180 capsule 0  . rivaroxaban (XARELTO) 20 MG TABS tablet TAKE 1 TABLET (20 MG TOTAL) BY MOUTH DAILY WITH SUPPER. 30 tablet 11  . umeclidinium-vilanterol (ANORO ELLIPTA) 62.5-25 MCG/INH AEPB INHALE 1 PUFF INTO THE LUNGS DAILY. 60 each 0  . valsartan (DIOVAN) 160 MG tablet TAKE 1 TABLET (160 MG TOTAL) BY MOUTH DAILY. 90 tablet 1  . azithromycin (ZITHROMAX) 500 MG tablet TAKE 1 TABLET BY MOUTH ONCE DAILYFOR 2 DAYS (Patient not taking: Reported on 09/23/2020) 2 tablet 0  . predniSONE (DELTASONE) 10 MG tablet TAKE BY MOUTH 4 TABS DAILY X2 DAYS, THEN 3 TABS X2 DAYS, THEN 2 TABS DAILY X2 DAYS, THEN 1 TAB DAILY X2 DAYS, THEN STOP. (Patient not taking: Reported on 09/23/2020) 20 tablet 0   No facility-administered medications prior to visit.      Review of Systems  Constitutional:   No  weight loss, night sweats,  Fevers, chills, fatigue, lassitude. HEENT:   No headaches,  Difficulty swallowing,  Tooth/dental problems,  Sore throat,                No sneezing, itching, ear ache, nasal congestion, post nasal drip,   CV:  No chest pain,  Orthopnea, PND, swelling in lower extremities, anasarca, dizziness, palpitations  GI  No heartburn, indigestion, abdominal pain, nausea, vomiting, diarrhea, change in bowel habits, loss of appetite  Resp: No shortness of breath with exertion or at rest.  No excess mucus, no productive cough,  No non-productive cough,  No coughing  up of blood.  No change in color of mucus.  No wheezing.  No chest wall  deformity  Skin: no rash or lesions.  GU: no dysuria, change in color of urine, no urgency or frequency.  No flank pain.  MS:  No joint pain or swelling.  No decreased range of motion.  No back pain.  Psych:  No change in mood or affect. No depression or anxiety.  No memory loss.     Objective:   Physical Exam  There were no vitals filed for this visit. No exam this is a phone visit      Assessment & Plan:  I personally reviewed all images and lab data in the [facility] system as well as any outside material available during this office visit and agree with the  radiology impressions.   Essential hypertension Difficult to say how well controlled the patient is he is on higher doses of diltiazem at this time along with valsartan 160 mg daily  We will bring this patient in for a lab draw visit  Continue diltiazem and valsartan  Atrial fibrillation [facility]) Patient states his heart rate is improved we will bring this patient in for direct exam and follow-up maintain diltiazem as prescribed along with Xarelto Sure patient has cardiology follow-up  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[place]) Will ensure refill on gabapentin  Prediabetes History of prediabetes diet controlled  Cigarette smoker    . Current smoking consumption amount: 1/2 pack a day  . Dicsussion on advise to quit smoking and smoking impacts: Cardiovascular lung impacts  . Patient's willingness to quit: Not yet ready to quit  . Methods to quit smoking discussed: Behavioral modification  . Medication management of smoking session drugs discussed: None discussed  . Resources provided:  AVS   . Setting quit date not established  . Follow-up arranged 1 month   Time spent counseling the patient: 5 minutes    Alcohol abuse Patient given alcoholics anonymous resource number to call he said he might call that number follow-up short-term with me in the office  Chronic anticoagulation Check CBC  COPD with chronic  bronchitis ([place]) Active smoking use had bronchospasm during hospitalization with hypoxemia  Will need further evaluation of lungs when he comes to the office  Maintain Anoro and albuterol for now   Diagnoses and all orders for this visit:  Longstanding persistent atrial fibrillation ([place]) -     Comprehensive metabolic panel; Future -     Ambulatory referral to Cardiology  Prediabetes -     Hemoglobin A1c; Future  Wheezing -     albuterol (VENTOLIN HFA) 108 (90 Base) MCG/ACT inhaler; INHALE 2 PUFFS INTO THE LUNGS EVERY 6 (SIX) HOURS AS NEEDED FOR WHEEZING OR SHORTNESS OF BREATH.  Numbness in feet -     gabapentin (NEURONTIN) 300 MG capsule; TAKE 2 CAPSULES (600 MG TOTAL) BY MOUTH 3 (THREE) TIMES DAILY.  Essential hypertension -     Comprehensive metabolic panel; Future -     CBC with Differential/Platelet; Future  Chronic anticoagulation -     CBC with Differential/Platelet; Future  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HCC)  Cigarette smoker  Alcohol abuse  COPD with chronic bronchitis ([place])  Other orders -     diltiazem (CARDIZEM CD) 120 MG 24 hr capsule; Take 2 capsules (240 mg total) by mouth in the morning and at bedtime. -     umeclidinium-vilanterol (ANORO ELLIPTA) 62.5-25 MCG/INH AEPB; Inhale 1 puff into the lungs daily. -     valsartan (DIOVAN)  160 MG tablet; TAKE 1 TABLET (160 MG TOTAL) BY MOUTH DAILY.  36 minutes of nonface-to-face time spent going through the patient's records reviewing discharge summaries lab data talking to the patient formulating plan he has complex medical decision making given his high risk  Follow Up Instructions: Patient knows to come in for a lab draw visit and as well refill sent to his pharmacy he will return to see me in short-term follow-up   I discussed the assessment and treatment plan with the patient. The patient was provided an opportunity to ask questions and all were answered. The patient agreed with the plan and demonstrated an  understanding of the instructions.   The patient was advised to call back or seek an in-person evaluation if the symptoms worsen or if the condition fails to improve as anticipated.  I provided 36 minutes of non-face-to-face time during this encounter  including  median intraservice time , review of notes, labs, imaging, medications  and explaining diagnosis and management to the patient .    Asencion Noble, MD

## 2020-09-23 NOTE — Assessment & Plan Note (Signed)
Patient given alcoholics anonymous resource number to call he said he might call that number follow-up short-term with me in the office

## 2020-09-23 NOTE — Assessment & Plan Note (Signed)
History of prediabetes diet controlled

## 2020-09-23 NOTE — Assessment & Plan Note (Signed)
  .   Current smoking consumption amount: 1/2 pack a day  . Dicsussion on advise to quit smoking and smoking impacts: Cardiovascular lung impacts  . Patient's willingness to quit: Not yet ready to quit  . Methods to quit smoking discussed: Behavioral modification  . Medication management of smoking session drugs discussed: None discussed  . Resources provided:  AVS   . Setting quit date not established  . Follow-up arranged 1 month   Time spent counseling the patient: 5 minutes

## 2020-09-23 NOTE — Assessment & Plan Note (Signed)
Difficult to say how well controlled the patient is he is on higher doses of diltiazem at this time along with valsartan 160 mg daily  We will bring this patient in for a lab draw visit  Continue diltiazem and valsartan

## 2020-09-23 NOTE — Assessment & Plan Note (Signed)
Active smoking use had bronchospasm during hospitalization with hypoxemia  Will need further evaluation of lungs when he comes to the office  Maintain Anoro and albuterol for now

## 2020-09-23 NOTE — Assessment & Plan Note (Signed)
Will ensure refill on gabapentin

## 2020-09-23 NOTE — Assessment & Plan Note (Signed)
Check CBC 

## 2020-09-29 ENCOUNTER — Other Ambulatory Visit: Payer: Self-pay

## 2020-09-30 ENCOUNTER — Other Ambulatory Visit: Payer: Self-pay

## 2020-10-04 ENCOUNTER — Ambulatory Visit: Payer: Self-pay | Attending: Critical Care Medicine

## 2020-10-04 ENCOUNTER — Other Ambulatory Visit: Payer: Self-pay

## 2020-10-04 DIAGNOSIS — I4811 Longstanding persistent atrial fibrillation: Secondary | ICD-10-CM

## 2020-10-04 DIAGNOSIS — Z7901 Long term (current) use of anticoagulants: Secondary | ICD-10-CM

## 2020-10-04 DIAGNOSIS — R7303 Prediabetes: Secondary | ICD-10-CM

## 2020-10-04 DIAGNOSIS — I10 Essential (primary) hypertension: Secondary | ICD-10-CM

## 2020-10-05 ENCOUNTER — Other Ambulatory Visit: Payer: Self-pay

## 2020-10-05 ENCOUNTER — Telehealth: Payer: Self-pay

## 2020-10-05 ENCOUNTER — Other Ambulatory Visit: Payer: Self-pay | Admitting: Critical Care Medicine

## 2020-10-05 DIAGNOSIS — E1065 Type 1 diabetes mellitus with hyperglycemia: Secondary | ICD-10-CM

## 2020-10-05 LAB — COMPREHENSIVE METABOLIC PANEL
ALT: 21 IU/L (ref 0–44)
AST: 29 IU/L (ref 0–40)
Albumin/Globulin Ratio: 1.4 (ref 1.2–2.2)
Albumin: 4 g/dL (ref 4.0–5.0)
Alkaline Phosphatase: 112 IU/L (ref 44–121)
BUN/Creatinine Ratio: 13 (ref 9–20)
BUN: 10 mg/dL (ref 6–24)
Bilirubin Total: 0.2 mg/dL (ref 0.0–1.2)
CO2: 22 mmol/L (ref 20–29)
Calcium: 9.6 mg/dL (ref 8.7–10.2)
Chloride: 100 mmol/L (ref 96–106)
Creatinine, Ser: 0.79 mg/dL (ref 0.76–1.27)
Globulin, Total: 2.9 g/dL (ref 1.5–4.5)
Glucose: 77 mg/dL (ref 65–99)
Potassium: 4.4 mmol/L (ref 3.5–5.2)
Sodium: 143 mmol/L (ref 134–144)
Total Protein: 6.9 g/dL (ref 6.0–8.5)
eGFR: 108 mL/min/{1.73_m2} (ref >59)

## 2020-10-05 LAB — CBC WITH DIFFERENTIAL/PLATELET
Basophils Absolute: 0.1 10*3/uL (ref 0.0–0.2)
Basos: 1 %
EOS (ABSOLUTE): 0.3 10*3/uL (ref 0.0–0.4)
Eos: 3 %
Hematocrit: 44.2 % (ref 37.5–51.0)
Hemoglobin: 15.2 g/dL (ref 13.0–17.7)
Immature Grans (Abs): 0 10*3/uL (ref 0.0–0.1)
Immature Granulocytes: 0 %
Lymphocytes Absolute: 2.9 10*3/uL (ref 0.7–3.1)
Lymphs: 34 %
MCH: 32.8 pg (ref 26.6–33.0)
MCHC: 34.4 g/dL (ref 31.5–35.7)
MCV: 96 fL (ref 79–97)
Monocytes Absolute: 1 10*3/uL — ABNORMAL HIGH (ref 0.1–0.9)
Monocytes: 11 %
Neutrophils Absolute: 4.4 10*3/uL (ref 1.4–7.0)
Neutrophils: 51 %
Platelets: 242 10*3/uL (ref 150–450)
RBC: 4.63 x10E6/uL (ref 4.14–5.80)
RDW: 11.8 % (ref 11.6–15.4)
WBC: 8.7 10*3/uL (ref 3.4–10.8)

## 2020-10-05 LAB — HEMOGLOBIN A1C
Est. average glucose Bld gHb Est-mCnc: 143 mg/dL
Hgb A1c MFr Bld: 6.6 % — ABNORMAL HIGH (ref 4.8–5.6)

## 2020-10-05 MED ORDER — METFORMIN HCL 500 MG PO TABS
500.0000 mg | ORAL_TABLET | Freq: Every day | ORAL | 3 refills | Status: DC
  Filled 2020-10-05: qty 30, 30d supply, fill #0

## 2020-10-05 NOTE — Telephone Encounter (Signed)
Pt informed of lab results and to begin taking metformin daily with breakfast medication sent to pharmacy. Pt verbalized understanding and voiced no other concerns.

## 2020-10-05 NOTE — Telephone Encounter (Signed)
-----   Message from Storm Frisk, MD sent at 10/05/2020  1:43 PM EDT ----- Let the pt know A1C is ok at 6.6   Blood sugar was 174, his prediabetes is now more fully developed type 2 diabetes and I recommend mefformin one pill with breakfast daily, sent to the pharmacy, kidney liver normal, blood counts normal,

## 2020-10-07 ENCOUNTER — Other Ambulatory Visit: Payer: Self-pay

## 2020-10-07 MED FILL — Furosemide Tab 20 MG: ORAL | 30 days supply | Qty: 30 | Fill #0 | Status: AC

## 2020-10-07 MED FILL — Duloxetine HCl Enteric Coated Pellets Cap 30 MG (Base Eq): ORAL | 30 days supply | Qty: 30 | Fill #0 | Status: AC

## 2020-10-08 ENCOUNTER — Other Ambulatory Visit: Payer: Self-pay

## 2020-10-26 ENCOUNTER — Other Ambulatory Visit: Payer: Self-pay

## 2020-10-26 ENCOUNTER — Encounter: Payer: Self-pay | Admitting: Critical Care Medicine

## 2020-10-26 ENCOUNTER — Ambulatory Visit: Payer: Self-pay | Attending: Critical Care Medicine | Admitting: Critical Care Medicine

## 2020-10-26 DIAGNOSIS — R062 Wheezing: Secondary | ICD-10-CM

## 2020-10-26 DIAGNOSIS — R200 Anesthesia of skin: Secondary | ICD-10-CM

## 2020-10-26 DIAGNOSIS — E1169 Type 2 diabetes mellitus with other specified complication: Secondary | ICD-10-CM

## 2020-10-26 DIAGNOSIS — I10 Essential (primary) hypertension: Secondary | ICD-10-CM

## 2020-10-26 DIAGNOSIS — R609 Edema, unspecified: Secondary | ICD-10-CM

## 2020-10-26 DIAGNOSIS — J449 Chronic obstructive pulmonary disease, unspecified: Secondary | ICD-10-CM

## 2020-10-26 DIAGNOSIS — I4811 Longstanding persistent atrial fibrillation: Secondary | ICD-10-CM

## 2020-10-26 DIAGNOSIS — G621 Alcoholic polyneuropathy: Secondary | ICD-10-CM

## 2020-10-26 DIAGNOSIS — Z7901 Long term (current) use of anticoagulants: Secondary | ICD-10-CM

## 2020-10-26 DIAGNOSIS — F17210 Nicotine dependence, cigarettes, uncomplicated: Secondary | ICD-10-CM

## 2020-10-26 MED ORDER — ALBUTEROL SULFATE HFA 108 (90 BASE) MCG/ACT IN AERS
2.0000 | INHALATION_SPRAY | Freq: Four times a day (QID) | RESPIRATORY_TRACT | 1 refills | Status: DC | PRN
  Filled 2020-10-26 – 2020-11-04 (×3): qty 18, 53d supply, fill #0
  Filled 2020-11-29: qty 18, 25d supply, fill #1

## 2020-10-26 MED ORDER — UMECLIDINIUM-VILANTEROL 62.5-25 MCG/INH IN AEPB
1.0000 | INHALATION_SPRAY | Freq: Every day | RESPIRATORY_TRACT | 1 refills | Status: DC
  Filled 2020-10-26: qty 60, 60d supply, fill #0
  Filled 2020-11-02: qty 60, 30d supply, fill #0
  Filled 2020-11-29: qty 60, 30d supply, fill #1

## 2020-10-26 MED ORDER — GABAPENTIN 300 MG PO CAPS
ORAL_CAPSULE | Freq: Three times a day (TID) | ORAL | 0 refills | Status: DC
  Filled 2020-10-26: qty 180, fill #0
  Filled 2020-11-02: qty 180, 30d supply, fill #0

## 2020-10-26 MED ORDER — ATORVASTATIN CALCIUM 10 MG PO TABS
10.0000 mg | ORAL_TABLET | Freq: Every day | ORAL | 3 refills | Status: DC
  Filled 2020-10-26 – 2020-11-04 (×2): qty 30, 30d supply, fill #0

## 2020-10-26 MED ORDER — DULOXETINE HCL 30 MG PO CPEP
ORAL_CAPSULE | Freq: Every day | ORAL | 1 refills | Status: DC
  Filled 2020-10-26: qty 90, fill #0
  Filled 2020-11-02: qty 30, 30d supply, fill #0
  Filled 2020-11-29: qty 30, 30d supply, fill #1

## 2020-10-26 MED ORDER — FUROSEMIDE 20 MG PO TABS
ORAL_TABLET | ORAL | 3 refills | Status: DC
  Filled 2020-10-26: qty 30, fill #0
  Filled 2020-11-02: qty 30, 30d supply, fill #0
  Filled 2020-11-29: qty 30, 30d supply, fill #1
  Filled 2020-12-29: qty 30, 30d supply, fill #2
  Filled 2021-02-01: qty 30, 30d supply, fill #3

## 2020-10-26 MED ORDER — VALSARTAN 160 MG PO TABS
ORAL_TABLET | Freq: Every day | ORAL | 1 refills | Status: DC
Start: 2020-10-26 — End: 2020-12-14
  Filled 2020-10-26: qty 90, fill #0

## 2020-10-26 NOTE — Progress Notes (Signed)
Subjective:    Patient ID: Kevin Duncan, male    DOB: [birth year], 50 y.o.   MRN: [id number] Virtual Visit via Telephone Note  I connected with Kevin Duncan on 10/26/20 at  9:30 AM EDT by telephone and verified that I am speaking with the correct person using two identifiers.   Consent:  I discussed the limitations, risks, security and privacy concerns of performing an evaluation and management service by telephone and the availability of in person appointments. I also discussed with the patient that there may be a patient responsible charge related to this service. The patient expressed understanding and agreed to proceed.  Location of patient: Patient's at home  Location of provider: I am in my office  Persons participating in the televisit with the patient.   No one else on the call    History of Present Illness: 50 y.o.M here to est PCP  Former [name] patient CAF, HTN, T2DM  Tobacco, ETOH use. With neuropathy on duloxitene  06/09/2020 Last OV was telehealth OV 01/07/20 with NP [name] The patient comes in today to officially establish for primary care.  At that last visit the patient complained of numbness and tingling the feet and the patient was on the gabapentin 600 mg 3 times daily at that visit duloxetine was added at 30 mg daily and trazodone given for sleep.  Patient had his diltiazem refilled at 2 tablets twice daily 120 mg tablets  Patient has history of longstanding chronic atrial fibrillation rate controlled.  He was to have a DC cardioversion but not pursue this because of insurance.  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  The patient's not on vitamin supplementation other than B12.  He cannot take folic acid due to side effects.  Patient is still smoking a pack a day of cigarettes.  Note on arrival blood pressure is 151/92.  Patient's BMI remains elevated at 33.  Patient denies any edema  in the lower extremities or chest pain.  The patient is yet to have received a Covid vaccine and refuses a flu vaccine at this visit.  He does not have any specific reasons why he is not gotten the Covid vaccine.  The patient does have type 2 diabetes and the last hemoglobin A1c was 6.1 in July the patient is diet controlled only and not on medications  09/23/2020 This is a 50 year old male who is seen post hospital follow-up.  Note the patient was unable to connect a video therefore we proceeded with a phone visit.  Patient is a chronic atrial fibrillation hypertension alcohol abuse active tobacco use.  Note he started drinking again on a binge basis after being sober and developed increased tachycardia increased dyspnea COPD exacerbation and subsequently admitted to the hospital in March.  Below is a copy of the discharge summary.  Admit date: 08/31/2020 Discharge date: 09/04/2020  Admitted From: Home Disposition: Home  Recommendations for Outpatient Follow-up:  1. Follow up with PCP-pulmonologist Dr. [name] next week  Home Health:no  Equipment/Devices: none  Discharge Condition: Stable Code Status:   Code Status: Full Code Diet recommendation:  Diet Order             Diet - low sodium heart healthy           Diet Heart Room service appropriate? Yes; Fluid consistency: Thin  Diet effective now  Brief/Interim Summary: 50 y.o.malewith medical history significantforA. 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 He endorses weight gain about 20 lb in 3 to 4 months, on and off leg swelling and sob worse on laying flat and during night.  ED Course:Was tachypneic saturating  85% room air needing 4l  to maintain his pulse oxto 96%, examwith diffuse expiratory wheezing,EKG rate controlled A. Fib,lab with elevated BNP troponin negative chest x-ray cardiomegaly. abg- pco2 50, po2 56, ph 7,4,hypokalemia 3.3,leukocytosis 14.7K.COVID-19 negative,patient was given nicotine, Zithromax, bronchodilators, Solu-Medrol 125 mg,and admission was requested for acute respiratory failure with hypoxia and COPD exacerbation On my eval he is aaox3, he reports he feels 80% better after nebs and steroid earlier in the ED. He is currently wheezing. He has had no chest pain.Patient otherwise denies any nausea, vomiting,fever, chills, headache, focal weakness, numbness tingling, speech difficulties Patient admitted for COPD exacerbation with extensive bilateral wheezing, and need for supplemental oxygen. Patient has managed IV steroids antibiotics bronchodilators, LABA LAMA and treated for COPD exacerbation Patient improved significantly subsequently not needing oxygen, at one point he was up to 3-5 L nasal cannula. He is doing well ambulatory and ambulatory pulse ox is stable. He is being discharged home on oral prednisone taper bronchodilators with instruction to follow-up with his PCP Dr. [name] pulmonologist in a week  Discharge Diagnoses:   Acute respiratory failure with hypoxia and hypercapnia:Suspecting acute COPD exacerbation. Not formally diagnosed w/COPD,but longstanding more than 20 years smoking history and significant wheezing on admission wheezing much better with IV steroid, Anoro Ellipta, bronchodilators.  Transitioned to oral prednisone-doing well overall.  He will be discharged home on steroid taper. Has had elevatedBNP200-400 but Echo with normal EF.has had leukocytosis but no pneumonia on chest x-ray, procalcitonin normal and WBC downtrending. Continue azithromycin.  ChronicAtrial fibrillationwith WGY:KZLD poorly controlled in the setting of COPD  exacerbation, bronchodilators.   But improved after resuming home dose Cardizem 240 mg twice daily. Cont his xarelto.  Essential hypertension:Stable continue Cardizem and his ARBs  Cigarette smoker-cessation has been counseled.  He is aware about the need to quit  Smoker's cough chronic-as needed antitussives  Alcohol abuse/Alcohol-induced polyneuropathy: No signs of withdrawal. Last EtOH use 3 to 4 days PTA.  Counseled to quit.  Morbid obesity with BMIBody mass index is 32.82 kg/m. Will benefit with PCP follow-up and weight loss.He will need pulmonary follow-up for sleep apnea evaluation given complaint of hypercapnia  Below is a transition of care visit from the nurse that occurred in March Toc visit RN     Transition Care Management Follow-up Telephone Call  Date of discharge and from where: 09/04/2020, [facility]       How have you been since you were released from the hospital? He stated that he feels " outstanding."  He then deferred all questions to his fiance, Neoma Laming.  Any questions or concerns? No  Items Reviewed:  Did the pt receive and understand the discharge instructions provided? Yes   Medications obtained and verified? Yes   -he has all medications, including the new ones,  and his fiance just called in refills to [facility]. He was given his 2 inhalers that he was using in the hospital She has also requested that the orders for his inhalers be transferred to [facility].   Other? No  Any new allergies since your discharge? No   Do you have support at home? Yes , Neoma Laming, his fiance  Home Care and Equipment/Supplies: Were home health services ordered? no If so, what is the name of the agency? n/a  Has the agency set up a time to come to the patient's home? not applicable Were any new equipment or medical supplies ordered?  No What is the name of the medical supply agency? n/a Were you able to get the supplies/equipment? not  applicable Do you have any questions related to the use of the equipment or supplies? No   He has a glucometer at home but is not using it.  She said that pre-diabetes was mentioned about a year ago, but they have not heard anything about that since then.   Functional Questionnaire: (I = Independent and D = Dependent) ADLs: independent. Neoma Laming manages his medications.   Follow up appointments reviewed:   PCP Hospital f/u appt confirmed? Yes  - Dr [name], 09/23/2020.  [facility] f/u appt confirmed? none scheduled    Are transportation arrangements needed? No   If their condition worsens, is the pt aware to call PCP or go to the Emergency Dept.? Yes  Was the patient provided with contact information for the PCP's office or ED? Yes, they already have the clinic phone number.  Was to pt encouraged to call back with questions or concerns? Yes  Patient states his dyspnea is improved he is having less cough less shortness of breath he is no longer drinking alcohol has been sober for 25 days note he has declined a COVID vaccination and he is aware of the risks of it given his history.  He does not have a method of checking his blood pressure at home.  His weight is about the same.  He is due post hospital labs.   10/26/2020 This patient is seen in return visit by telephone as he did not have technology for video Patient has history of hypertension and COPD.  He states he is using his albuterol inhaler more is having more shortness of breath as minimal mucus production.  He is now down to 1/2 pack a day of cigarettes.  He denies any alcohol use.  He was drinking heavily but has not been drinking for many months.  He states his neuropathy from alcohol is improved.  Noted a recent lab draw his A1c was at 6.6.  He has checked his blood pressure at [facility] and has been in the 130/90 range.  He is not on statin therapy at this time. The patient does maintain diltiazem 240 mg twice daily,  metformin, Xarelto, albuterol, Cymbalta, furosemide, gabapentin, Anoro, and valsartan.  He is in need of refills.  The patient is yet to see cardiology in follow-up.  I went over the patient with him all of his lab results from April after I saw him as a telephone visit 1 April  All the lab data was stable    Past Medical History:  Diagnosis Date  . Atrial fibrillation ([place])   . COPD (chronic obstructive pulmonary disease) ([place])   . Hypertension   . Prediabetes      Family History  Problem Relation Age of Onset  . Diabetes Mother   . Heart disease Father   . Heart disease Brother   . Diabetes Maternal Grandmother   . Heart disease Paternal Grandmother      Social History   Socioeconomic History  . Marital status: Single  Spouse name: Not on file  . Number of children: Not on file  . Years of education: Not on file  . Highest education level: Not on file  Occupational History  . Not on file  Tobacco Use  . Smoking status: Current Every Day Smoker    Packs/day: 1.50    Types: Cigarettes  . Smokeless tobacco: Never Used  . Tobacco comment: 3/4 pack daily  Vaping Use  . Vaping Use: Never used  Substance and Sexual Activity  . Alcohol use: Not Currently    Alcohol/week: 3.0 standard drinks    Types: 3 Standard drinks or equivalent per week    Comment: 5-6 beer daily  . Drug use: No  . Sexual activity: Not on file  Other Topics Concern  . Not on file  Social History Narrative  . Not on file   Social Determinants of Health   Financial Resource Strain: Not on file  Food Insecurity: Not on file  Transportation Needs: Not on file  Physical Activity: Not on file  Stress: Not on file  Social Connections: Not on file  Intimate Partner Violence: Not on file     Allergies  Allergen Reactions  . Folic Acid Swelling    redness  . Hydrocodone Hives  . Trazodone And Nefazodone Swelling     Outpatient Medications Prior to Visit  Medication Sig Dispense  Refill  . Blood Glucose Monitoring Suppl (TRUE METRIX METER) w/Device KIT 1 kit by Does not apply route daily. Use to check fasting blood sugar daily. 1 kit 0  . diltiazem (CARDIZEM CD) 120 MG 24 hr capsule Take 2 capsules (240 mg total) by mouth in the morning and at bedtime. 240 capsule 1  . glucose blood test strip Use to check fasting blood sugar daily. 100 each 2  . metFORMIN (GLUCOPHAGE) 500 MG tablet Take 1 tablet (500 mg total) by mouth daily with breakfast. 90 tablet 3  . rivaroxaban (XARELTO) 20 MG TABS tablet Take 1 tablet (20 mg total) by mouth daily with supper. 30 tablet 11  . TRUEplus Lancets 28G MISC Use to check fasting blood sugar daily. 100 each 2  . vitamin B-12 (CYANOCOBALAMIN) 1000 MCG tablet Take 1,000 mcg by mouth daily.     Marland Kitchen albuterol (VENTOLIN HFA) 108 (90 Base) MCG/ACT inhaler INHALE 2 PUFFS INTO THE LUNGS EVERY 6 (SIX) HOURS AS NEEDED FOR WHEEZING OR SHORTNESS OF BREATH. 8.5 g 0  . DULoxetine (CYMBALTA) 30 MG capsule TAKE 1 CAPSULE (30 MG TOTAL) BY MOUTH DAILY. 90 capsule 1  . furosemide (LASIX) 20 MG tablet TAKE 1 TABLET BY MOUTH DAILY FOR 3 DAYS THEN AS NEEDED FOR LEG SWELLING 30 tablet 3  . gabapentin (NEURONTIN) 300 MG capsule TAKE 2 CAPSULES (600 MG TOTAL) BY MOUTH 3 (THREE) TIMES DAILY. 180 capsule 0  . umeclidinium-vilanterol (ANORO ELLIPTA) 62.5-25 MCG/INH AEPB Inhale 1 puff into the lungs daily. 60 each 1  . valsartan (DIOVAN) 160 MG tablet TAKE 1 TABLET (160 MG TOTAL) BY MOUTH DAILY. 90 tablet 1   No facility-administered medications prior to visit.      Review of Systems  HENT: Negative.   Respiratory: Positive for cough and shortness of breath. Negative for wheezing.   Cardiovascular: Negative.  Negative for palpitations.  Gastrointestinal: Negative.   Genitourinary: Negative.   Musculoskeletal: Negative.   Neurological: Positive for numbness.      Objective:   Physical Exam There were no vitals filed for this visit. No exam this is a  phone  visit      Assessment & Plan:  I personally reviewed all images and lab data in the [facility] system as well as any outside material available during this office visit and agree with the  radiology impressions.   Essential hypertension Appears to be stable at this time  Refills given  COPD with chronic bronchitis ([place]) COPD stable this time continue Anoro and albuterol  Type 2 diabetes mellitus (HCC) Continue metformin begin atorvastatin  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HCC) Alcohol induced neuropathy improved off alcohol  Chronic anticoagulation CBC stable on Xarelto  Cigarette smoker    . Current smoking consumption amount: 1/2 pack a day  . Dicsussion on advise to quit smoking and smoking impacts: Cardiovascular lung impacts  . Patient's willingness to quit: Not yet ready to quit  . Methods to quit smoking discussed: Behavioral modification  . Medication management of smoking session drugs discussed: None discussed  . Resources provided:  AVS   . Setting quit date not established  . Follow-up arranged 1 month   Time spent counseling the patient: 5 minutes     Diagnoses and all orders for this visit:  Wheezing -     albuterol (VENTOLIN HFA) 108 (90 Base) MCG/ACT inhaler; Inhale 2 puffs into the lungs every 6 (six) hours as needed for wheezing or shortness of breath.  Longstanding persistent atrial fibrillation (HCC) -     DULoxetine (CYMBALTA) 30 MG capsule; TAKE 1 CAPSULE (30 MG TOTAL) BY MOUTH DAILY.  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HCC) -     DULoxetine (CYMBALTA) 30 MG capsule; TAKE 1 CAPSULE (30 MG TOTAL) BY MOUTH DAILY.  Peripheral edema -     furosemide (LASIX) 20 MG tablet; TAKE 1 TABLET BY MOUTH DAILY FOR 3 DAYS THEN AS NEEDED FOR LEG SWELLING  Numbness in feet -     gabapentin (NEURONTIN) 300 MG capsule; TAKE 2 CAPSULES (600 MG TOTAL) BY MOUTH 3 (THREE) TIMES DAILY.  Essential hypertension  COPD with chronic bronchitis (HCC)  Type 2  diabetes mellitus with other specified complication, without long-term current use of insulin (HCC)  Chronic anticoagulation  Cigarette smoker  Other orders -     umeclidinium-vilanterol (ANORO ELLIPTA) 62.5-25 MCG/INH AEPB; Inhale 1 puff into the lungs daily. -     valsartan (DIOVAN) 160 MG tablet; TAKE 1 TABLET (160 MG TOTAL) BY MOUTH DAILY. -     atorvastatin (LIPITOR) 10 MG tablet; Take 1 tablet (10 mg total) by mouth daily.  20 minutes of nonface-to-face time spent going through the patient's records reviewing discharge summaries lab data talking to the patient formulating plan he has complex medical decision making given his high risk  Follow Up Instructions: Patient knows to come in for face-to-face visit within the next 2 to 3 weeks  I discussed the assessment and treatment plan with the patient. The patient was provided an opportunity to ask questions and all were answered. The patient agreed with the plan and demonstrated an understanding of the instructions.   The patient was advised to call back or seek an in-person evaluation if the symptoms worsen or if the condition fails to improve as anticipated.  I provided 20 minutes of non-face-to-face time during this encounter  including  median intraservice time , review of notes, labs, imaging, medications  and explaining diagnosis and management to the patient .    Asencion Noble, MD

## 2020-10-26 NOTE — Assessment & Plan Note (Signed)
Appears to be stable at this time  Refills given

## 2020-10-26 NOTE — Assessment & Plan Note (Signed)
Continue metformin begin atorvastatin

## 2020-10-26 NOTE — Assessment & Plan Note (Signed)
CBC stable on Xarelto

## 2020-10-26 NOTE — Assessment & Plan Note (Signed)
COPD stable this time continue Anoro and albuterol

## 2020-10-26 NOTE — Assessment & Plan Note (Signed)
  .   Current smoking consumption amount: 1/2 pack a day  . Dicsussion on advise to quit smoking and smoking impacts: Cardiovascular lung impacts  . Patient's willingness to quit: Not yet ready to quit  . Methods to quit smoking discussed: Behavioral modification  . Medication management of smoking session drugs discussed: None discussed  . Resources provided:  AVS   . Setting quit date not established  . Follow-up arranged 1 month   Time spent counseling the patient: 5 minutes

## 2020-10-26 NOTE — Assessment & Plan Note (Signed)
Alcohol induced neuropathy improved off alcohol

## 2020-11-02 ENCOUNTER — Other Ambulatory Visit: Payer: Self-pay

## 2020-11-04 ENCOUNTER — Other Ambulatory Visit: Payer: Self-pay

## 2020-11-29 ENCOUNTER — Other Ambulatory Visit: Payer: Self-pay | Admitting: Critical Care Medicine

## 2020-11-29 ENCOUNTER — Other Ambulatory Visit: Payer: Self-pay

## 2020-11-29 DIAGNOSIS — R200 Anesthesia of skin: Secondary | ICD-10-CM

## 2020-11-29 MED ORDER — GABAPENTIN 300 MG PO CAPS
ORAL_CAPSULE | Freq: Three times a day (TID) | ORAL | 0 refills | Status: DC
  Filled 2020-11-29: qty 180, 30d supply, fill #0

## 2020-12-02 ENCOUNTER — Other Ambulatory Visit: Payer: Self-pay

## 2020-12-05 NOTE — Progress Notes (Deleted)
Subjective:    Patient ID: Kevin Duncan, male    DOB: 10-Oct-1970, 50 y.o.   MRN: [id number]  History of Present Illness: 50 y.o.M here to est PCP  Former Kevin Duncan patient CAF, HTN, T2DM  Tobacco, ETOH use. With neuropathy on duloxitene  06/09/2020 Last OV was telehealth OV 01/07/20 with NP Kevin Duncan The patient comes in today to officially establish for primary care.  At that last visit the patient complained of numbness and tingling the feet and the patient was on the gabapentin 600 mg 3 times daily at that visit duloxetine was added at 30 mg daily and trazodone given for sleep.  Patient had his diltiazem refilled at 2 tablets twice daily 120 mg tablets  Patient has history of longstanding chronic atrial fibrillation rate controlled.  He was to have a DC cardioversion but not pursue this because of insurance.  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  The patient's not on vitamin supplementation other than B12.  He cannot take folic acid due to side effects.  Patient is still smoking a pack a day of cigarettes.  Note on arrival blood pressure is 151/92.  Patient's BMI remains elevated at 33.  Patient denies any edema in the lower extremities or chest pain.  The patient is yet to have received a Covid vaccine and refuses a flu vaccine at this visit.  He does not have any specific reasons why he is not gotten the Covid vaccine.  The patient does have type 2 diabetes and the last hemoglobin A1c was 6.1 in July the patient is diet controlled only and not on medications  09/23/2020 This is a 50 year old male who is seen post hospital follow-up.  Note the patient was unable to connect a video therefore we proceeded with a phone visit.  Patient is a chronic atrial fibrillation hypertension alcohol abuse active tobacco use.  Note he started drinking again on a binge basis after being sober and developed increased tachycardia  increased dyspnea COPD exacerbation and subsequently admitted to the hospital in March.  Below is a copy of the discharge summary.  Admit date: 08/31/2020 Discharge date: 09/04/2020   Admitted From: Home Disposition: Home   Recommendations for Outpatient Follow-up:  1. Follow up with PCP-pulmonologist Dr. Joya Duncan next week   Home Health:no  Equipment/Devices: none   Discharge Condition: Stable Code Status:   Code Status: Full Code Diet recommendation:  Diet Order                 Diet - low sodium heart healthy             Diet Heart Room service appropriate? Yes; Fluid consistency: Thin  Diet effective now                 Brief/Interim Summary: 50 y.o. male with medical history significant for A. 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 He endorses weight gain about  20 lb in 3 to 4 months, on and off leg swelling and sob worse on laying flat and during night.   ED Course: Was tachypneic saturating 85% room air needing 4l Kevin Duncan to maintain his pulse ox to 96%, exam with diffuse expiratory wheezing,  EKG rate controlled A. Fib, lab with elevated BNP troponin negative chest x-ray cardiomegaly. abg- pco2 50, po2 56, ph 7,4, hypokalemia 3.3, leukocytosis 14.7K.  COVID-19 negative, patient was given nicotine, Zithromax, bronchodilators, Solu-Medrol 125 mg, and admission was requested for acute respiratory failure with hypoxia and COPD exacerbation On my eval he is aaox3, he reports he feels 80% better after nebs and steroid earlier in the ED. He is currently wheezing. He has had no chest pain.Patient otherwise denies any nausea, vomiting,fever, chills, headache, focal weakness, numbness tingling, speech  difficulties Patient admitted for COPD exacerbation with extensive bilateral wheezing, and need for supplemental oxygen. Patient has managed IV steroids antibiotics bronchodilators, LABA LAMA and treated for COPD exacerbation Patient improved significantly subsequently not needing oxygen, at one point he was up to 3-5 L nasal cannula. He is doing well ambulatory and ambulatory pulse ox is stable. He is being discharged home on oral prednisone taper bronchodilators with instruction to follow-up with his PCP Dr. Joya Duncan pulmonologist in a week   Discharge Diagnoses:    Acute respiratory failure with hypoxia and hypercapnia:  Suspecting acute COPD exacerbation.  Not formally diagnosed w/ COPD, but longstanding more than 20 years smoking history and significant wheezing on admission wheezing much better with IV steroid, Anoro Ellipta, bronchodilators.   Transitioned to oral prednisone-doing well overall.  He will be discharged home on steroid taper. Has had elevated BNP 200-400 but Echo with normal EF.has had leukocytosis but no pneumonia on chest x-ray, procalcitonin normal and WBC downtrending.  Continue azithromycin.   Chronic Atrial fibrillation with RVR: Rate poorly controlled in the setting of COPD exacerbation, bronchodilators.    But improved after resuming home dose Cardizem 240 mg twice daily. Cont his xarelto.   Essential hypertension: Stable continue Cardizem and his ARBs   Cigarette smoker-cessation has been counseled.  He is aware about the need to quit   Smoker's cough chronic-as needed antitussives   Alcohol abuse/Alcohol-induced polyneuropathy: No signs of withdrawal.  Last EtOH use 3 to 4 days PTA.  Counseled to quit.   Morbid obesity with BMI Body mass index is 32.82 kg/m.  Will benefit with PCP follow-up and weight loss. He will need pulmonary follow-up for sleep apnea evaluation given complaint of hypercapnia  Below is a transition of care visit from the nurse that occurred in  March Toc visit RN     Transition Care Management Follow-up Telephone Call  Date of discharge and from where: 09/04/2020, [facility]       How have you been since you were released from the hospital? He stated that he feels " outstanding."  He then deferred all questions to his fiance, Neoma Laming.  Any questions or concerns? No   Items Reviewed:  Did the pt receive and understand the discharge instructions provided? Yes   Medications obtained and verified? Yes   -he has all medications, including the new ones,  and his fiance just called in refills to [facility]. He was given his 2 inhalers that he was using in the hospital She has also requested that the orders for his inhalers be transferred to [facility].   Other? No   Any new allergies since your discharge? No   Do you have support at home? Yes , Neoma Laming, his fiance   Home Care and Equipment/Supplies: Were home health services ordered? no If so, what is the name of the agency? n/a  Has the agency set up a time to come to the patient's  home? not applicable Were any new equipment or medical supplies ordered?  No What is the name of the medical supply agency? n/a Were you able to get the supplies/equipment? not applicable Do you have any questions related to the use of the equipment or supplies? No    He has a glucometer at home but is not using it.  She said that pre-diabetes was mentioned about a year ago, but they have not heard anything about that since then.    Functional Questionnaire: (I = Independent and D = Dependent) ADLs: independent. Neoma Laming manages his medications.    Follow up appointments reviewed:    PCP Hospital f/u appt confirmed? Yes  - Dr Kevin Duncan, 09/23/2020.  [facility] f/u appt confirmed? none scheduled    Are transportation arrangements needed? No   If their condition worsens, is the pt aware to call PCP or go to the Emergency Dept.? Yes  Was the patient provided with contact  information for the PCP's office or ED? Yes, they already have the clinic phone number.  Was to pt encouraged to call back with questions or concerns? Yes   Patient states his dyspnea is improved he is having less cough less shortness of breath he is no longer drinking alcohol has been sober for 25 days note he has declined a COVID vaccination and he is aware of the risks of it given his history.  He does not have a method of checking his blood pressure at home.  His weight is about the same.  He is due post hospital labs.    10/26/2020 This patient is seen in return visit by telephone as he did not have technology for video Patient has history of hypertension and COPD.  He states he is using his albuterol inhaler more is having more shortness of breath as minimal mucus production.  He is now down to 1/2 pack a day of cigarettes.  He denies any alcohol use.  He was drinking heavily but has not been drinking for many months.  He states his neuropathy from alcohol is improved.  Noted a recent lab draw his A1c was at 6.6.  He has checked his blood pressure at [facility] and has been in the 130/90 range.  He is not on statin therapy at this time. The patient does maintain diltiazem 240 mg twice daily, metformin, Xarelto, albuterol, Cymbalta, furosemide, gabapentin, Anoro, and valsartan.  He is in need of refills.  The patient is yet to see cardiology in follow-up.  I went over the patient with him all of his lab results from April after I saw him as a telephone visit 1 April  All the lab data was stable  12/06/20  Essential hypertension Appears to be stable at this time  Refills given  COPD with chronic bronchitis ([place]) COPD stable this time continue Anoro and albuterol  Type 2 diabetes mellitus (HCC) Continue metformin begin atorvastatin  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[place]) Alcohol induced neuropathy improved off alcohol  Chronic anticoagulation CBC stable on Xarelto  Cigarette  smoker   Past Medical History:  Diagnosis Date   Atrial fibrillation (HCC)    COPD (chronic obstructive pulmonary disease) ([place])    Hypertension    Prediabetes      Family History  Problem Relation Age of Onset   Diabetes Mother    Heart disease Father    Heart disease Brother    Diabetes Maternal Grandmother    Heart disease Paternal Grandmother      Social History  Socioeconomic History   Marital status: Single    Spouse name: Not on file   Number of children: Not on file   Years of education: Not on file   Highest education level: Not on file  Occupational History   Not on file  Tobacco Use   Smoking status: Every Day    Packs/day: 1.50    Pack years: 0.00    Types: Cigarettes   Smokeless tobacco: Never   Tobacco comments:    3/4 pack daily  Vaping Use   Vaping Use: Never used  Substance and Sexual Activity   Alcohol use: Not Currently    Alcohol/week: 3.0 standard drinks    Types: 3 Standard drinks or equivalent per week    Comment: 5-6 beer daily   Drug use: No   Sexual activity: Not on file  Other Topics Concern   Not on file  Social History Narrative   Not on file   Social Determinants of Health   Financial Resource Strain: Not on file  Food Insecurity: Not on file  Transportation Needs: Not on file  Physical Activity: Not on file  Stress: Not on file  Social Connections: Not on file  Intimate Partner Violence: Not on file     Allergies  Allergen Reactions   Folic Acid Swelling    redness   Hydrocodone Hives   Trazodone And Nefazodone Swelling     Outpatient Medications Prior to Visit  Medication Sig Dispense Refill   albuterol (VENTOLIN HFA) 108 (90 Base) MCG/ACT inhaler Inhale 2 puffs into the lungs every 6 (six) hours as needed for wheezing or shortness of breath. 18 g 1   atorvastatin (LIPITOR) 10 MG tablet Take 1 tablet (10 mg total) by mouth daily. 90 tablet 3   Blood Glucose Monitoring Suppl (TRUE METRIX METER) w/Device KIT 1  kit by Does not apply route daily. Use to check fasting blood sugar daily. 1 kit 0   diltiazem (CARDIZEM CD) 120 MG 24 hr capsule Take 2 capsules (240 mg total) by mouth in the morning and at bedtime. 240 capsule 1   DULoxetine (CYMBALTA) 30 MG capsule TAKE 1 CAPSULE (30 MG TOTAL) BY MOUTH DAILY. 90 capsule 1   furosemide (LASIX) 20 MG tablet TAKE 1 TABLET BY MOUTH DAILY FOR 3 DAYS THEN AS NEEDED FOR LEG SWELLING 30 tablet 3   gabapentin (NEURONTIN) 300 MG capsule TAKE 2 CAPSULES (600 MG TOTAL) BY MOUTH 3 (THREE) TIMES DAILY. 180 capsule 0   glucose blood test strip Use to check fasting blood sugar daily. 100 each 2   metFORMIN (GLUCOPHAGE) 500 MG tablet Take 1 tablet (500 mg total) by mouth daily with breakfast. 90 tablet 3   rivaroxaban (XARELTO) 20 MG TABS tablet Take 1 tablet (20 mg total) by mouth daily with supper. 30 tablet 11   TRUEplus Lancets 28G MISC Use to check fasting blood sugar daily. 100 each 2   umeclidinium-vilanterol (ANORO ELLIPTA) 62.5-25 MCG/INH AEPB Inhale 1 puff into the lungs daily. 60 each 1   valsartan (DIOVAN) 160 MG tablet TAKE 1 TABLET (160 MG TOTAL) BY MOUTH DAILY. 90 tablet 1   vitamin B-12 (CYANOCOBALAMIN) 1000 MCG tablet Take 1,000 mcg by mouth daily.      No facility-administered medications prior to visit.      Review of Systems  HENT: Negative.    Respiratory:  Positive for cough and shortness of breath. Negative for wheezing.   Cardiovascular: Negative.  Negative for palpitations.  Gastrointestinal: Negative.  Genitourinary: Negative.   Musculoskeletal: Negative.   Neurological:  Positive for numbness.     Objective:   Physical Exam There were no vitals filed for this visit. No exam this is a phone visit      Assessment & Plan:  I personally reviewed all images and lab data in the [facility] system as well as any outside material available during this office visit and agree with the  radiology impressions.   No problem-specific Assessment & Plan  notes found for this encounter.   There are no diagnoses linked to this encounter. 20 minutes of nonface-to-face time spent going through the patient's records reviewing discharge summaries lab data talking to the patient formulating plan he has complex medical decision making given his high risk  Follow Up Instructions: Patient knows to come in for face-to-face visit within the next 2 to 3 weeks  I discussed the assessment and treatment plan with the patient. The patient was provided an opportunity to ask questions and all were answered. The patient agreed with the plan and demonstrated an understanding of the instructions.   The patient was advised to call back or seek an in-person evaluation if the symptoms worsen or if the condition fails to improve as anticipated.  I provided 20 minutes of non-face-to-face time during this encounter  including  median intraservice time , review of notes, labs, imaging, medications  and explaining diagnosis and management to the patient .    Asencion Noble, MD

## 2020-12-06 ENCOUNTER — Ambulatory Visit: Payer: Self-pay | Admitting: Critical Care Medicine

## 2020-12-10 NOTE — Progress Notes (Signed)
Cardiology Office Note:    Date:  12/14/2020   ID:  Autumn Messing, DOB 07-09-1970, MRN [id number]  PCP:  Elsie Stain, MD  Cardiologist:  None  Electrophysiologist:  None   Referring MD: Elsie Stain, MD   Chief Complaint/Reason for Referral: Atrial fibrillation  History of Present Illness:    Kevin Duncan is a 50 y.o. 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 On 09/23/2020 he had a telemedicine visit with Asencion Noble, MD for longstanding persistent atrial fibrillation. He was maintained on diltiazem and Xarelto, and referred for a cardiology follow-up.   Today, he is accompanied by his fiance Hilda Blades, who also provides some history and manages his medications with a pillbox. He states he is feeling fine overall. Occasionally he has heart flutters and dyspnea on exertion, palpitations greatly improved after starting diltiazem. He takes 4 tablets of Diltiazem and he does notice improvement. Xarelto is still 20 mg a day. He denies missing any doses in the last 30 days, although some doses may have been taken a few hours late.  He has been offered cardioversion in the past but this was deferred due to lack of insurance.  I have offered this again today but advised that I am not sure what the bill to the patient would be.  Also, he uses an inhaler for shortness of breath and wheezing and he has COPD, which he attributes to smoking cigarettes. However, he believes the inhaler only intermittently relieves his symptoms.  Of note, a few weeks ago, he developed apical chest pain with a duration of five minutes. He does not have chest pain at this time.  He almost presented to the hospital with this discomfort.  We discussed stress testing in detail and I have offered him a nuclear  stress test today.    Additionally, he also has bilateral LE tingling, aches, and edema. If he is not careful, he will begin to have some gait instability due to neuropathy.  There are visible varicose veins on his LE's as well. Typically he takes Lasix at night, and this interrupts his sleep often (3 hours of good sleep a night). If he takes it during the day, he does not urinate frequently enough for the medication to have much effect.  He feels his diuresis is only effective if he is supine.  I have asked him to speak to Dr. [name] and a urologist if he is interested since I cannot explain this phenomenon from a cardiovascular etiology.  Previously, he usually drank 10% malt liquor. For his diet, he has stopped drinking alcohol since his admittance to the ED on 08/31/2020. At that time he was told he had COPD. He has not taken Valsartan since his hospital visit due to lightheadedness which resolved after he stopped.  Metformin causes side effects of stomach upset and myalgias, so he self-discontinued this.   He denies any headaches, lightheadedness, or syncope. Also has no orthopnea or PND.   Past Medical History:  Diagnosis Date   Atrial fibrillation ([place])    COPD (chronic obstructive pulmonary disease) ([place])    Hypertension    Prediabetes     Past Surgical History:  Procedure Laterality Date   THUMB FUSION Bilateral     Current Medications: Current Meds  Medication Sig   albuterol (VENTOLIN HFA) 108 (  90 Base) MCG/ACT inhaler Inhale 2 puffs into the lungs every 6 (six) hours as needed for wheezing or shortness of breath.   Blood Glucose Monitoring Suppl (TRUE METRIX METER) w/Device KIT 1 kit by Does not apply route daily. Use to check fasting blood sugar daily.   diltiazem (CARDIZEM CD) 120 MG 24 hr capsule Take 2 capsules (240 mg total) by mouth in the morning and at bedtime.   DULoxetine (CYMBALTA) 30 MG capsule TAKE 1 CAPSULE (30 MG TOTAL) BY MOUTH DAILY.   furosemide (LASIX) 20  MG tablet TAKE 1 TABLET BY MOUTH DAILY FOR 3 DAYS THEN AS NEEDED FOR LEG SWELLING   gabapentin (NEURONTIN) 300 MG capsule TAKE 2 CAPSULES (600 MG TOTAL) BY MOUTH 3 (THREE) TIMES DAILY.   glucose blood test strip Use to check fasting blood sugar daily.   rivaroxaban (XARELTO) 20 MG TABS tablet Take 1 tablet (20 mg total) by mouth daily with supper.   TRUEplus Lancets 28G MISC Use to check fasting blood sugar daily.   umeclidinium-vilanterol (ANORO ELLIPTA) 62.5-25 MCG/INH AEPB Inhale 1 puff into the lungs daily.     Allergies:   Folic acid, Hydrocodone, and Trazodone and nefazodone   Social History   Tobacco Use   Smoking status: Every Day    Packs/day: 1.50    Pack years: 0.00    Types: Cigarettes   Smokeless tobacco: Never   Tobacco comments:    3/4 pack daily  Vaping Use   Vaping Use: Never used  Substance Use Topics   Alcohol use: Not Currently    Alcohol/week: 3.0 standard drinks    Types: 3 Standard drinks or equivalent per week    Comment: 5-6 beer daily   Drug use: No     Family History: The patient's family history includes Diabetes in his maternal grandmother and mother; Heart disease in his brother, father, and paternal grandmother.  ROS:   Please see the history of present illness.    (+) Palpitations (+) Dyspnea on exertion (+) Shortness of breath (+) Wheezing (+) Bilateral LE tingling, aches, and edema (+) Gait instability (+) Varicose veins All other systems reviewed and are negative.  EKGs/Labs/Other Studies Reviewed:    The following studies were reviewed today:  Echo 09/01/2020:  1. Left ventricular ejection fraction, by estimation, is 55 to 60%. The  left ventricle has normal function. The left ventricle has no regional  wall motion abnormalities. Left ventricular diastolic function could not  be evaluated.   2. Right ventricular systolic function is normal. The right ventricular  size is normal. Tricuspid regurgitation signal is inadequate for  assessing  PA pressure.   3. Left atrial size was mildly dilated.   4. The mitral valve is grossly normal. Trivial mitral valve  regurgitation. No evidence of mitral stenosis.   5. The aortic valve is tricuspid. Aortic valve regurgitation is not  visualized. No aortic stenosis is present.   Comparison(s): No significant change from prior study.  Echo 12/18/2019:  1. Left ventricular ejection fraction, by estimation, is 50%. The left  ventricle has low normal function. The left ventricle has no regional wall  motion abnormalities. There is moderate left ventricular hypertrophy. Left  ventricular diastolic parameters   are indeterminate.   2. Right ventricular systolic function is normal. The right ventricular  size is normal. Tricuspid regurgitation signal is inadequate for assessing  PA pressure.   3. Left atrial size was mildly dilated.   4. Right atrial size was mildly dilated.  5. The mitral valve is normal in structure. Trivial mitral valve  regurgitation. No evidence of mitral stenosis.   6. The aortic valve is normal in structure. Aortic valve regurgitation is  not visualized. No aortic stenosis is present.   7. Aortic dilatation noted. There is borderline dilatation of the aortic  root measuring 39 mm.   8. The inferior vena cava is normal in size with greater than 50%  respiratory variability, suggesting right atrial pressure of 3 mmHg.   EKG:  12/14/2020: Atrial fibrillation, Septal infarct, Rate 68 bpm    Recent Labs: 12/18/2019: TSH 1.230 12/20/2019: Magnesium 1.9 09/01/2020: B Natriuretic Peptide 427.4 10/04/2020: ALT 21; BUN 10; Creatinine, Ser 0.79; Hemoglobin 15.2; Platelets 242; Potassium 4.4; Sodium 143  Recent Lipid Panel No results found for: CHOL, TRIG, HDL, CHOLHDL, VLDL, LDLCALC, LDLDIRECT  Physical Exam:    VS:  BP 122/64 (BP Location: Left Arm, Patient Position: Sitting, Cuff Size: Large)   Pulse 68   Ht 6' (1.829 m)   Wt 226 lb 3.2 oz (102.6 kg)    SpO2 97%   BMI 30.68 kg/m     Wt Readings from Last 5 Encounters:  12/14/20 226 lb 3.2 oz (102.6 kg)  09/04/20 232 lb 14.4 oz (105.6 kg)  06/09/20 243 lb (110.2 kg)  01/07/20 237 lb (107.5 kg)  12/30/19 230 lb 9.6 oz (104.6 kg)    Constitutional: No acute distress Eyes: sclera non-icteric, normal conjunctiva and lids ENMT: normal dentition, moist mucous membranes Cardiovascular: Irregular rhythm, normal rate, no murmurs. S1 and S2 normal. Radial pulses normal bilaterally. No jugular venous distention.  Respiratory: clear to auscultation bilaterally, no wheezing GI : normal bowel sounds, soft and nontender. No distention.   MSK: extremities warm, well perfused. No edema. Varicose veins NEURO: grossly nonfocal exam, moves all extremities. PSYCH: alert and oriented x 3, normal mood and affect.   ASSESSMENT:    1. Persistent atrial fibrillation (HCC)   2. Secondary hypercoagulable state (HCC)   3. Essential hypertension   4. Dyspnea on exertion   5. Chest discomfort   6. Cigarette smoker    PLAN:    Persistent atrial fibrillation (HCC) - Plan: EKG 12-Lead Secondary hypercoagulable state (HCC) -Patient is stable on diltiazem 240 mg twice daily, continue this for rate control. -He has mild LA enlargement on most recent echo from March, could consider antiarrhythmic therapy, however patient already feels he takes too much medication. -He is compliant with Xarelto and has no bleeding complications.  Would continue for now to facilitate cardioversion if needed and if he would like to pursue.  I have offered cardioversion to be arranged given no interruptions in anticoagulation in the last 30 days, he will think about it but defers currently due to lack of insurance.  Essential hypertension-blood pressure stable on diltiazem for both rate control as well as antihypertensive therapy.  He self discontinued valsartan due to lightheadedness.  Dyspnea on exertion-likely mixed etiology, from  atrial fibrillation, COPD, and possible CAD, see next problem.  Chest discomfort-patient has symptoms of cardiac chest pain with apical discomfort that lasts greater than 5 minutes.  He has not tried nitroglycerin.  We will provide the patient with a prescription for nitroglycerin, we did not have a chance to discuss this in our office visit, but this will be communicated to them today.  I have strongly urged him to present to the ER with any recurrent chest pain due to high probability of CAD given his risk factors.  I offered the patient a nuclear stress test today, he defers since he feels overall well and lacks insurance.  I have informed the patient that I do not know what the test would cost if he were to pursue it with self-pay but would recommend stress testing given his concerning symptoms.  Cigarette smoker-encourage cessation, he can seek out resources from his primary care doctor.  He is not interested in quitting at the moment since he just recently quit drinking alcohol.   Total time of encounter: 31 minutes total time of encounter, including 25 minutes spent in face-to-face patient care on the date of this encounter. This time includes coordination of care and counseling regarding above mentioned problem list. Remainder of non-face-to-face time involved reviewing chart documents/testing relevant to the patient encounter and documentation in the medical record. I have independently reviewed documentation from referring provider.   Cherlynn Kaiser, MD, [facility]     Medication Adjustments/Labs and Tests Ordered: Current medicines are reviewed at length with the patient today.  Concerns regarding medicines are outlined above.   Orders Placed This Encounter  Procedures   EKG 12-Lead    No orders of the defined types were placed in this encounter.   Patient Instructions  Medication Instructions:  No Changes In Medications at this time.  *If you need a  refill on your cardiac medications before your next appointment, please call your pharmacy*  Follow-Up: At [facility], you and your health needs are our priority.  As part of our continuing mission to provide you with exceptional heart care, we have created designated Provider Care Teams.  These Care Teams include your primary Cardiologist (physician) and Advanced Practice Providers (APPs -  Physician Assistants and Nurse Practitioners) who all work together to provide you with the care you need, when you need it.  Your next appointment:   6 month(s)  The format for your next appointment:   In Person  Provider:   You may see Dr. Margaretann Loveless or one of the following Advanced Practice Providers on your designated Care Team:   Rosaria Ferries, PA-C Jory Sims, DNP, ANP    I,Mathew Stumpf,acting as a scribe for Elouise Munroe, MD.,have documented all relevant documentation on the behalf of Elouise Munroe, MD,as directed by  Elouise Munroe, MD while in the presence of Elouise Munroe, MD.  I, Elouise Munroe, MD, have reviewed all documentation for this visit. The documentation on 12/14/20 for the exam, diagnosis, procedures, and orders are all accurate and complete.

## 2020-12-14 ENCOUNTER — Other Ambulatory Visit: Payer: Self-pay

## 2020-12-14 ENCOUNTER — Encounter: Payer: Self-pay | Admitting: Internal Medicine

## 2020-12-14 ENCOUNTER — Ambulatory Visit (INDEPENDENT_AMBULATORY_CARE_PROVIDER_SITE_OTHER): Payer: Self-pay | Admitting: Internal Medicine

## 2020-12-14 ENCOUNTER — Encounter: Payer: Self-pay | Admitting: Critical Care Medicine

## 2020-12-14 ENCOUNTER — Ambulatory Visit: Payer: Self-pay | Attending: Critical Care Medicine | Admitting: Critical Care Medicine

## 2020-12-14 ENCOUNTER — Telehealth: Payer: Self-pay | Admitting: Licensed Clinical Social Worker

## 2020-12-14 VITALS — BP 122/64 | HR 68 | Ht 72.0 in | Wt 226.2 lb

## 2020-12-14 DIAGNOSIS — R0609 Other forms of dyspnea: Secondary | ICD-10-CM

## 2020-12-14 DIAGNOSIS — I4819 Other persistent atrial fibrillation: Secondary | ICD-10-CM

## 2020-12-14 DIAGNOSIS — I10 Essential (primary) hypertension: Secondary | ICD-10-CM

## 2020-12-14 DIAGNOSIS — I4811 Longstanding persistent atrial fibrillation: Secondary | ICD-10-CM

## 2020-12-14 DIAGNOSIS — R200 Anesthesia of skin: Secondary | ICD-10-CM

## 2020-12-14 DIAGNOSIS — E785 Hyperlipidemia, unspecified: Secondary | ICD-10-CM

## 2020-12-14 DIAGNOSIS — Z7901 Long term (current) use of anticoagulants: Secondary | ICD-10-CM

## 2020-12-14 DIAGNOSIS — F17210 Nicotine dependence, cigarettes, uncomplicated: Secondary | ICD-10-CM

## 2020-12-14 DIAGNOSIS — J449 Chronic obstructive pulmonary disease, unspecified: Secondary | ICD-10-CM

## 2020-12-14 DIAGNOSIS — G621 Alcoholic polyneuropathy: Secondary | ICD-10-CM

## 2020-12-14 DIAGNOSIS — D6869 Other thrombophilia: Secondary | ICD-10-CM

## 2020-12-14 DIAGNOSIS — R0600 Dyspnea, unspecified: Secondary | ICD-10-CM

## 2020-12-14 DIAGNOSIS — R0789 Other chest pain: Secondary | ICD-10-CM

## 2020-12-14 DIAGNOSIS — E1169 Type 2 diabetes mellitus with other specified complication: Secondary | ICD-10-CM

## 2020-12-14 MED ORDER — DILTIAZEM HCL ER COATED BEADS 120 MG PO CP24
240.0000 mg | ORAL_CAPSULE | Freq: Two times a day (BID) | ORAL | 1 refills | Status: DC
  Filled 2020-12-14: qty 240, 60d supply, fill #0
  Filled 2020-12-29: qty 120, 30d supply, fill #0
  Filled 2021-02-01: qty 120, 30d supply, fill #1
  Filled 2021-03-01: qty 120, 30d supply, fill #2
  Filled 2021-05-02: qty 120, 30d supply, fill #3

## 2020-12-14 MED ORDER — UMECLIDINIUM-VILANTEROL 62.5-25 MCG/INH IN AEPB
1.0000 | INHALATION_SPRAY | Freq: Every day | RESPIRATORY_TRACT | 3 refills | Status: AC
  Filled 2020-12-14: qty 60, 60d supply, fill #0
  Filled 2020-12-29: qty 60, 30d supply, fill #0
  Filled 2021-02-01: qty 60, 30d supply, fill #1
  Filled 2021-03-01: qty 120, 60d supply, fill #2

## 2020-12-14 MED ORDER — NICOTINE POLACRILEX 4 MG MT LOZG
LOZENGE | OROMUCOSAL | 4 refills | Status: AC
  Filled 2020-12-14 – 2020-12-29 (×2): qty 72, 24d supply, fill #0

## 2020-12-14 MED ORDER — GABAPENTIN 300 MG PO CAPS
ORAL_CAPSULE | Freq: Three times a day (TID) | ORAL | 3 refills | Status: DC
  Filled 2020-12-14: qty 180, fill #0
  Filled 2020-12-29: qty 180, 30d supply, fill #0
  Filled 2021-02-01: qty 180, 30d supply, fill #1
  Filled 2021-03-01: qty 180, 30d supply, fill #2
  Filled 2021-05-02: qty 180, 30d supply, fill #3

## 2020-12-14 MED ORDER — DULOXETINE HCL 60 MG PO CPEP
60.0000 mg | ORAL_CAPSULE | Freq: Every day | ORAL | 1 refills | Status: AC
  Filled 2020-12-14 – 2020-12-29 (×2): qty 30, 30d supply, fill #0
  Filled 2021-02-01: qty 30, 30d supply, fill #1
  Filled 2021-03-01: qty 30, 30d supply, fill #2
  Filled 2021-05-02: qty 30, 30d supply, fill #3
  Filled 2021-06-10: qty 30, 30d supply, fill #4

## 2020-12-14 MED ORDER — NITROGLYCERIN 0.4 MG SL SUBL
0.4000 mg | SUBLINGUAL_TABLET | SUBLINGUAL | 3 refills | Status: AC | PRN
  Filled 2020-12-14: qty 25, 15d supply, fill #0
  Filled 2020-12-29: qty 25, 5d supply, fill #0

## 2020-12-14 NOTE — Progress Notes (Addendum)
Subjective:    Patient ID: Kevin Duncan, male    DOB: 13-Jul-1970, 50 y.o.   MRN: [id number]  Virtual Visit via Telephone Note  I connected with [place] on 12/14/20 at 10:00 AM EDT by telephone and verified that I am speaking with the correct person using two identifiers.   Consent:  I discussed the limitations, risks, security and privacy concerns of performing an evaluation and management service by telephone and the availability of in person appointments. I also discussed with the patient that there may be a patient responsible charge related to this service. The patient expressed understanding and agreed to proceed.  Location of patient: Patient is at home  Location of provider: I am in my office  Persons participating in the televisit with the patient.   No one else on the call    History of Present Illness: 50 y.o.M here to est PCP  Former [name] patient CAF, HTN, T2DM  Tobacco, ETOH use. With neuropathy on duloxitene  06/09/2020 Last OV was telehealth OV 01/07/20 with NP [name] The patient comes in today to officially establish for primary care.  At that last visit the patient complained of numbness and tingling the feet and the patient was on the gabapentin 600 mg 3 times daily at that visit duloxetine was added at 30 mg daily and trazodone given for sleep.  Patient had his diltiazem refilled at 2 tablets twice daily 120 mg tablets  Patient has history of longstanding chronic atrial fibrillation rate controlled.  He was to have a DC cardioversion but not pursue this because of insurance.  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  The patient's not on vitamin supplementation other than B12.  He cannot take folic acid due to side effects.  Patient is still smoking a pack a day of cigarettes.  Note on arrival blood pressure is 151/92.  Patient's BMI remains elevated at 33.  Patient denies any edema  in the lower extremities or chest pain.  The patient is yet to have received a Covid vaccine and refuses a flu vaccine at this visit.  He does not have any specific reasons why he is not gotten the Covid vaccine.  The patient does have type 2 diabetes and the last hemoglobin A1c was 6.1 in July the patient is diet controlled only and not on medications  09/23/2020 This is a 50 year old male who is seen post hospital follow-up.  Note the patient was unable to connect a video therefore we proceeded with a phone visit.  Patient is a chronic atrial fibrillation hypertension alcohol abuse active tobacco use.  Note he started drinking again on a binge basis after being sober and developed increased tachycardia increased dyspnea COPD exacerbation and subsequently admitted to the hospital in March.  Below is a copy of the discharge summary.  Admit date: 08/31/2020 Discharge date: 09/04/2020   Admitted From: Home Disposition: Home   Recommendations for Outpatient Follow-up:  1. Follow up with PCP-pulmonologist Dr. [name] next week   Home Health:no  Equipment/Devices: none   Discharge Condition: Stable Code Status:   Code Status: Full Code Diet recommendation:  Diet Order                 Diet - low sodium heart healthy             Diet Heart Room service appropriate? Yes; Fluid consistency:  Thin  Diet effective now                 Brief/Interim Summary: 50 y.o. male with medical history significant for A. 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 He endorses weight gain about  20 lb in 3 to 4 months, on and off leg swelling and sob worse on laying flat and during night.   ED Course: Was tachypneic saturating  85% room air needing 4l [place] to maintain his pulse ox to 96%, exam with diffuse expiratory wheezing, EKG rate controlled A. Fib, lab with elevated BNP troponin negative chest x-ray cardiomegaly. abg- pco2 50, po2 56, ph 7,4, hypokalemia 3.3, leukocytosis 14.7K.  COVID-19 negative, patient was given nicotine, Zithromax, bronchodilators, Solu-Medrol 125 mg, and admission was requested for acute respiratory failure with hypoxia and COPD exacerbation On my eval he is aaox3, he reports he feels 80% better after nebs and steroid earlier in the ED. He is currently wheezing. He has had no chest pain.Patient otherwise denies any nausea, vomiting,fever, chills, headache, focal weakness, numbness tingling, speech difficulties Patient admitted for COPD exacerbation with extensive bilateral wheezing, and need for supplemental oxygen. Patient has managed IV steroids antibiotics bronchodilators, LABA LAMA and treated for COPD exacerbation Patient improved significantly subsequently not needing oxygen, at one point he was up to 3-5 L nasal cannula. He is doing well ambulatory and ambulatory pulse ox is stable. He is being discharged home on oral prednisone taper bronchodilators with instruction to follow-up with his PCP Dr. [name] pulmonologist in a week   Discharge Diagnoses:    Acute respiratory failure with hypoxia and hypercapnia:  Suspecting acute COPD exacerbation.  Not formally diagnosed w/ COPD, but longstanding more than 20 years smoking history and significant wheezing on admission wheezing much better with IV steroid, Anoro Ellipta, bronchodilators.   Transitioned to oral prednisone-doing well overall.  He will be discharged home on steroid taper. Has had elevated BNP 200-400 but Echo with normal EF.has had leukocytosis but no pneumonia on chest x-ray, procalcitonin normal and WBC downtrending.  Continue azithromycin.   Chronic Atrial fibrillation with RVR: Rate poorly controlled in the setting of COPD  exacerbation, bronchodilators.    But improved after resuming home dose Cardizem 240 mg twice daily. Cont his xarelto.   Essential hypertension: Stable continue Cardizem and his ARBs   Cigarette smoker-cessation has been counseled.  He is aware about the need to quit   Smoker's cough chronic-as needed antitussives   Alcohol abuse/Alcohol-induced polyneuropathy: No signs of withdrawal.  Last EtOH use 3 to 4 days PTA.  Counseled to quit.   Morbid obesity with BMI Body mass index is 32.82 kg/m.  Will benefit with PCP follow-up and weight loss. He will need pulmonary follow-up for sleep apnea evaluation given complaint of hypercapnia  Below is a transition of care visit from the nurse that occurred in March Toc visit RN     Transition Care Management Follow-up Telephone Call  Date of discharge and from where: 09/04/2020, [facility]       How have you been since you were released from the hospital? He stated that he feels " outstanding."  He then deferred all questions to his fiance, Neoma Laming.  Any questions or concerns? No   Items Reviewed:  Did the pt  receive and understand the discharge instructions provided? Yes   Medications obtained and verified? Yes   -he has all medications, including the new ones,  and his fiance just called in refills to [facility]. He was given his 2 inhalers that he was using in the hospital She has also requested that the orders for his inhalers be transferred to [facility].   Other? No   Any new allergies since your discharge? No   Do you have support at home? Yes , Gavin Pound, his fiance   Home Care and Equipment/Supplies: Were home health services ordered? no If so, what is the name of the agency? n/a  Has the agency set up a time to come to the patient's home? not applicable Were any new equipment or medical supplies ordered?  No What is the name of the medical supply agency? n/a Were you able to get the supplies/equipment? not  applicable Do you have any questions related to the use of the equipment or supplies? No    He has a glucometer at home but is not using it.  She said that pre-diabetes was mentioned about a year ago, but they have not heard anything about that since then.    Functional Questionnaire: (I = Independent and D = Dependent) ADLs: independent. Gavin Pound manages his medications.    Follow up appointments reviewed:    PCP Hospital f/u appt confirmed? Yes  - Dr Delford Field, 09/23/2020.  Specialist Hospital f/u appt confirmed? none scheduled    Are transportation arrangements needed? No   If their condition worsens, is the pt aware to call PCP or go to the Emergency Dept.? Yes  Was the patient provided with contact information for the PCP's office or ED? Yes, they already have the clinic phone number.  Was to pt encouraged to call back with questions or concerns? Yes   Patient states his dyspnea is improved he is having less cough less shortness of breath he is no longer drinking alcohol has been sober for 25 days note he has declined a COVID vaccination and he is aware of the risks of it given his history.  He does not have a method of checking his blood pressure at home.  His weight is about the same.  He is due post hospital labs.    10/26/2020 This patient is seen in return visit by telephone as he did not have technology for video Patient has history of hypertension and COPD.  He states he is using his albuterol inhaler more is having more shortness of breath as minimal mucus production.  He is now down to 1/2 pack a day of cigarettes.  He denies any alcohol use.  He was drinking heavily but has not been drinking for many months.  He states his neuropathy from alcohol is improved.  Noted a recent lab draw his A1c was at 6.6.  He has checked his blood pressure at [facility] and has been in the 130/90 range.  He is not on statin therapy at this time. The patient does maintain diltiazem 240 mg twice daily,  metformin, Xarelto, albuterol, Cymbalta, furosemide, gabapentin, Anoro, and valsartan.  He is in need of refills.  The patient is yet to see cardiology in follow-up.  I went over the patient with him all of his lab results from April after I saw him as a telephone visit 1 April  All the lab data was stable  12/13/20 This patient is seen by way of a phone visit.  His last visit was also by phone in May.  The patient states overall he has had no changes.  He did see cardiology face-to-face earlier today.  No changes were made.  He states when he took the metformin up from the last visit he began having nausea and we had to stop the medication.  He also complains of ongoing tingling and bruising in both feet.  He has not had alcohol since March.  He is still smoking a pack a day of cigarettes.  He states the gabapentin helps to some degree but not 100% for the feet.  He is only on 30 mg a day of duloxetine as well.  On arrival at the previous cardiology visit his blood pressure was good.  He is trying to eat a healthier diet at this time.  He maintains the Anoro and albuterol inhalers and his pulmonary status is stable.  He does maintain Xarelto for chronic anticoagulation.  He does have excess urination at night because he takes the furosemide 20 mg at bedtime.  He takes all of his other medications at night as well   Past Medical History:  Diagnosis Date   Atrial fibrillation (HCC)    COPD (chronic obstructive pulmonary disease) ([employer])    Hypertension    Prediabetes      Family History  Problem Relation Age of Onset   Diabetes Mother    Heart disease Father    Heart disease Brother    Diabetes Maternal Grandmother    Heart disease Paternal Grandmother      Social History   Socioeconomic History   Marital status: Single    Spouse name: Not on file   Number of children: Not on file   Years of education: Not on file   Highest education level: Not on file  Occupational History   Not  on file  Tobacco Use   Smoking status: Every Day    Packs/day: 1.50    Pack years: 0.00    Types: Cigarettes   Smokeless tobacco: Never   Tobacco comments:    3/4 pack daily  Vaping Use   Vaping Use: Never used  Substance and Sexual Activity   Alcohol use: Not Currently    Alcohol/week: 3.0 standard drinks    Types: 3 Standard drinks or equivalent per week    Comment: 5-6 beer daily   Drug use: No   Sexual activity: Not on file  Other Topics Concern   Not on file  Social History Narrative   Not on file   Social Determinants of Health   Financial Resource Strain: Medium Risk   Difficulty of Paying Living Expenses: Somewhat hard  Food Insecurity: No Food Insecurity   Worried About Charity fundraiser in the Last Year: Never true   Ran Out of Food in the Last Year: Never true  Transportation Needs: No Transportation Needs   Lack of Transportation (Medical): No   Lack of Transportation (Non-Medical): No  Physical Activity: Not on file  Stress: Not on file  Social Connections: Not on file  Intimate Partner Violence: Not on file     Allergies  Allergen Reactions   Atorvastatin Itching and Swelling   Folic Acid Swelling    redness   Hydrocodone Hives   Trazodone And Nefazodone Swelling     Outpatient Medications Prior to Visit  Medication Sig Dispense Refill   albuterol (VENTOLIN HFA) 108 (90 Base) MCG/ACT inhaler Inhale 2 puffs into the lungs every 6 (six) hours  as needed for wheezing or shortness of breath. 18 g 1   Blood Glucose Monitoring Suppl (TRUE METRIX METER) w/Device KIT 1 kit by Does not apply route daily. Use to check fasting blood sugar daily. 1 kit 0   furosemide (LASIX) 20 MG tablet TAKE 1 TABLET BY MOUTH DAILY FOR 3 DAYS THEN AS NEEDED FOR LEG SWELLING (Patient taking differently: Take 20 mg by mouth daily as needed.) 30 tablet 3   glucose blood test strip Use to check fasting blood sugar daily. 100 each 2   nitroGLYCERIN (NITROSTAT) 0.4 MG SL tablet  Place 1 tablet (0.4 mg total) under the tongue every 5 (five) minutes as needed for chest pain. 25 tablet 3   rivaroxaban (XARELTO) 20 MG TABS tablet Take 1 tablet (20 mg total) by mouth daily with supper. 30 tablet 11   TRUEplus Lancets 28G MISC Use to check fasting blood sugar daily. 100 each 2   diltiazem (CARDIZEM CD) 120 MG 24 hr capsule Take 2 capsules (240 mg total) by mouth in the morning and at bedtime. 240 capsule 1   DULoxetine (CYMBALTA) 30 MG capsule TAKE 1 CAPSULE (30 MG TOTAL) BY MOUTH DAILY. 90 capsule 1   gabapentin (NEURONTIN) 300 MG capsule TAKE 2 CAPSULES (600 MG TOTAL) BY MOUTH 3 (THREE) TIMES DAILY. 180 capsule 0   umeclidinium-vilanterol (ANORO ELLIPTA) 62.5-25 MCG/INH AEPB Inhale 1 puff into the lungs daily. 60 each 1   vitamin B-12 (CYANOCOBALAMIN) 1000 MCG tablet Take 1,000 mcg by mouth daily. (Patient not taking: Reported on 12/14/2020)     No facility-administered medications prior to visit.      Review of Systems  HENT: Negative.    Respiratory:  Positive for shortness of breath. Negative for cough and wheezing.   Cardiovascular:  Positive for chest pain. Negative for palpitations.  Gastrointestinal: Negative.   Genitourinary: Negative.   Musculoskeletal: Negative.   Neurological:  Positive for numbness.     Objective:   Physical Exam There were no vitals filed for this visit. No exam this is a phone visit      Assessment & Plan:  I personally reviewed all images and lab data in the [facility] system as well as any outside material available during this office visit and agree with the  radiology impressions.   Type 2 diabetes mellitus ([place]) Established type 2 diabetes failed metformin  I have asked the patient to check his blood sugar more often we will hold on further medications and see him back in the clinic and reassess with another A1c and lab data I went over briefly the patient the concepts of a healthy diet  COPD with chronic bronchitis ([place]) COPD  with chronic bronchitis stable at this time with exception of still smoking heavily  See tobacco use assessment  Continue anoro check  and albuterol refills given  Essential hypertension Blood pressure at the cardiology visit was good will maintain current medications without change  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[place]) Plan continue gabapentin as currently prescribed and will increase duloxetine to 60 mg daily eventually would like to get this patient over to neurology for evaluation  Cigarette smoker    Current smoking consumption amount: 1 pack a day  Dicsussion on advise to quit smoking and smoking impacts: Cardiovascular lung impacts  Patient's willingness to quit: Not yet ready to quit  Methods to quit smoking discussed: Behavioral modification  Medication management of smoking session drugs discussed: None discussed  Resources provided:  AVS   Setting quit date  not established  Follow-up arranged 1 month   Time spent counseling the patient: 5 minutes    Hyperlipidemia associated with type 2 diabetes mellitus ([place]) Had to stop atorvastatin due to side effects  Chronic anticoagulation On chronic Xarelto due to atrial fibrillation will monitor blood counts when he returns to the office   Diagnoses and all orders for this visit:  Longstanding persistent atrial fibrillation (HCC) -     DULoxetine (CYMBALTA) 60 MG capsule; Take 1 capsule (60 mg total) by mouth daily.  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HCC) -     DULoxetine (CYMBALTA) 60 MG capsule; Take 1 capsule (60 mg total) by mouth daily.  Numbness in feet -     gabapentin (NEURONTIN) 300 MG capsule; TAKE 2 CAPSULES (600 MG TOTAL) BY MOUTH 3 (THREE) TIMES DAILY.  Type 2 diabetes mellitus with other specified complication, without long-term current use of insulin (HCC)  COPD with chronic bronchitis (HCC)  Essential hypertension  Cigarette smoker  Hyperlipidemia associated with type 2 diabetes mellitus  (HCC)  Chronic anticoagulation  Other orders -     diltiazem (CARDIZEM CD) 120 MG 24 hr capsule; Take 2 capsules (240 mg total) by mouth in the morning and at bedtime. -     umeclidinium-vilanterol (ANORO ELLIPTA) 62.5-25 MCG/INH AEPB; Inhale 1 puff into the lungs daily. -     nicotine polacrilex (NICORETTE MINI) 4 MG lozenge; Use three times a day to quit smoking 31 minutes of nonface-to-face time spent going through the patient's records reviewing discharge summaries lab data talking to the patient formulating plan he has complex medical decision making given his high risk  Follow Up Instructions: Patient knows to come in for face-to-face visit within the next 6 weeks in august I discussed the assessment and treatment plan with the patient. The patient was provided an opportunity to ask questions and all were answered. The patient agreed with the plan and demonstrated an understanding of the instructions.   The patient was advised to call back or seek an in-person evaluation if the symptoms worsen or if the condition fails to improve as anticipated I provided 31 minutes of non-face-to-face time during this encounter  including  median intraservice time , review of notes, labs, imaging, medications  and explaining diagnosis and management to the patient .    Asencion Noble, MD

## 2020-12-14 NOTE — Assessment & Plan Note (Signed)
Blood pressure at the cardiology visit was good will maintain current medications without change

## 2020-12-14 NOTE — Assessment & Plan Note (Signed)
Had to stop atorvastatin due to side effects

## 2020-12-14 NOTE — Assessment & Plan Note (Signed)
Plan continue gabapentin as currently prescribed and will increase duloxetine to 60 mg daily eventually would like to get this patient over to neurology for evaluation

## 2020-12-14 NOTE — Progress Notes (Signed)
Heart and Vascular Care Navigation  12/14/2020  Kevin Duncan [facility] 05/22/1971 [id number]  Reason for Referral:  Engaged with patient by telephone for initial visit for Heart and Vascular Care Coordination.                                                                                                   Assessment:  LCSW noted from appt notes today at [facility] office that pt currently uninsured, on Xarelto, and putting off recommended procedures due to lack of insurance. LCSW reached out to pt via telephone at [phone]. Introduced self, role, and reason for call. Pt confirms home address, pcp and requests I speak with his significant other Kevin Duncan (DPR on file). LCSW re-introduced self, role, reason for call. Pt and [name] live together; she works, pt doesn't work and does not currently receive disability or any additional income. Neither recieves food assistance at this time. Kevin Duncan thinks that pt already recieves PAP assistance for Xarelto but isnt sure. I encouraged her to complete PAP and bring back to [facility]. Pt also has attempted to apply for CAFA before but had difficulty getting letter of nonfiling so was not able to formally submit. LCSW offered to mail a new application along with more specific instructions to pt and pt sig. Other to complete. They are amenable. No additional questions/concerns expressed to this Clinical research associate at this time.                                     HRT/VAS Care Coordination     Patients Home Cardiology Office [facility]   Outpatient Care Team Social Worker   Social Worker Name: [name], LCSW, [facility]   Living arrangements for the past 2 months Mobile Home   Lives with: Significant Other   Patient Current Insurance Coverage Self-Pay   Patient Has Concern With Paying Medical Bills Yes   Patient Concerns With Medical Bills past medical bills, ongoing medical procedures recommended   Medical Bill Referrals: pt mailed a CAFA form to complete    Does Patient Have Prescription Coverage? No   Patient Prescription Assistance Programs Patient Assistance Programs   Home Assistive Devices/Equipment Eyeglasses  reading glasses   DME Agency NA   HH Agency NA       Social History:                                                                             SDOH Screenings   Alcohol Screen: Not on file  Depression (PHQ2-9): Low Risk    PHQ-2 Score: 0  Financial Resource Strain: Medium Risk   Difficulty of Paying Living Expenses: Somewhat hard  Food Insecurity: No Food Insecurity   Worried About Programme researcher, broadcasting/film/video  in the Last Year: Never true   Ran Out of Food in the Last Year: Never true  Housing: Not on file  Physical Activity: Not on file  Social Connections: Not on file  Stress: Not on file  Tobacco Use: High Risk   Smoking Tobacco Use: Every Day   Smokeless Tobacco Use: Never  Transportation Needs: No Transportation Needs   Lack of Transportation (Medical): No   Lack of Transportation (Non-Medical): No    SDOH Interventions: Financial Resources:  Financial Strain Interventions: Other (Comment), Artist (provided CAFA, Xarelto PAP and SNAP applications; reminded pt that [name], Artist remains available as do I to assist as needed) Editor, commissioning for [employer]  Food Insecurity:  Food Insecurity Interventions: Other (Comment) (mailed pt SNAP application and info about DSS)  Housing Insecurity:     Transportation:   Transportation Interventions: Intervention Not Indicated    Other Care Navigation Interventions:     Provided Pharmacy assistance resources Patient Assistance Programs   Follow-up plan:   Mailed pt [employer] PAP app for Xarelto, CAFA form, SNAP application and my card for any additional questions/concerns. Remain available as needed. Will f/u in 2 weeks to see if any additional questions.

## 2020-12-14 NOTE — Assessment & Plan Note (Signed)
COPD with chronic bronchitis stable at this time with exception of still smoking heavily  See tobacco use assessment  Continue anoro check  and albuterol refills given

## 2020-12-14 NOTE — Patient Instructions (Signed)
Medication Instructions:  No Changes In Medications at this time.  *If you need a refill on your cardiac medications before your next appointment, please call your pharmacy*  Follow-Up: At [facility], you and your health needs are our priority.  As part of our continuing mission to provide you with exceptional heart care, we have created designated Provider Care Teams.  These Care Teams include your primary Cardiologist (physician) and Advanced Practice Providers (APPs -  Physician Assistants and Nurse Practitioners) who all work together to provide you with the care you need, when you need it.  Your next appointment:   6 month(s)  The format for your next appointment:   In Person  Provider:   You may see Dr. Jacques Navy or one of the following Advanced Practice Providers on your designated Care Team:   Theodore Demark, PA-C Joni Reining, DNP, ANP

## 2020-12-14 NOTE — Assessment & Plan Note (Signed)
On chronic Xarelto due to atrial fibrillation will monitor blood counts when he returns to the office

## 2020-12-14 NOTE — Assessment & Plan Note (Signed)
  .   Current smoking consumption amount: 1 pack a day  . Dicsussion on advise to quit smoking and smoking impacts: Cardiovascular lung impacts  . Patient's willingness to quit: Not yet ready to quit  . Methods to quit smoking discussed: Behavioral modification  . Medication management of smoking session drugs discussed: None discussed  . Resources provided:  AVS   . Setting quit date not established  . Follow-up arranged 1 month   Time spent counseling the patient: 5 minutes

## 2020-12-14 NOTE — Assessment & Plan Note (Signed)
Established type 2 diabetes failed metformin  I have asked the patient to check his blood sugar more often we will hold on further medications and see him back in the clinic and reassess with another A1c and lab data I went over briefly the patient the concepts of a healthy diet

## 2020-12-22 ENCOUNTER — Other Ambulatory Visit: Payer: Self-pay

## 2020-12-27 ENCOUNTER — Other Ambulatory Visit: Payer: Self-pay

## 2020-12-29 ENCOUNTER — Other Ambulatory Visit: Payer: Self-pay | Admitting: Critical Care Medicine

## 2020-12-29 ENCOUNTER — Other Ambulatory Visit: Payer: Self-pay

## 2020-12-29 DIAGNOSIS — R062 Wheezing: Secondary | ICD-10-CM

## 2020-12-29 MED ORDER — ALBUTEROL SULFATE HFA 108 (90 BASE) MCG/ACT IN AERS
2.0000 | INHALATION_SPRAY | Freq: Four times a day (QID) | RESPIRATORY_TRACT | 1 refills | Status: DC | PRN
  Filled 2020-12-29: qty 18, 25d supply, fill #0
  Filled 2021-02-01: qty 18, 25d supply, fill #1

## 2020-12-30 ENCOUNTER — Other Ambulatory Visit: Payer: Self-pay

## 2020-12-31 ENCOUNTER — Other Ambulatory Visit: Payer: Self-pay

## 2021-01-03 ENCOUNTER — Ambulatory Visit: Payer: Self-pay | Admitting: *Deleted

## 2021-01-03 NOTE — Telephone Encounter (Signed)
Reason for Disposition  [1] MILD dizziness (e.g., walking normally) AND [2] has been evaluated by physician for this  Answer Assessment - Initial Assessment Questions 1. DESCRIPTION: "Describe your dizziness."     lightheaded 2. LIGHTHEADED: "Do you feel lightheaded?" (e.g., somewhat faint, woozy, weak upon standing)     Not now  3. VERTIGO: "Do you feel like either you or the room is spinning or tilting?" (i.e. vertigo)     No  4. SEVERITY: "How bad is it?"  "Do you feel like you are going to faint?" "Can you stand and walk?"   - MILD: Feels slightly dizzy, but walking normally.   - MODERATE: Feels unsteady when walking, but not falling; interferes with normal activities (e.g., school, work).   - SEVERE: Unable to walk without falling, or requires assistance to walk without falling; feels like passing out now.      Na  5. ONSET:  "When did the dizziness begin?"     Last weekend 6. AGGRAVATING FACTORS: "Does anything make it worse?" (e.g., standing, change in head position)     Medications are not being given at the same time while in jail  7. HEART RATE: "Can you tell me your heart rate?" "How many beats in 15 seconds?"  (Note: not all patients can do this)       na 8. CAUSE: "What do you think is causing the dizziness?"     Not taking medications right time  9. RECURRENT SYMPTOM: "Have you had dizziness before?" If Yes, ask: "When was the last time?" "What happened that time?"     Na  10. OTHER SYMPTOMS: "Do you have any other symptoms?" (e.g., fever, chest pain, vomiting, diarrhea, bleeding)       Chest pain  11. PREGNANCY: "Is there any chance you are pregnant?" "When was your last menstrual period?"       na  Protocols used: Dizziness - Lightheadedness-A-AH

## 2021-01-03 NOTE — Telephone Encounter (Signed)
Patient called to report he has had symptoms of dizziness, chest pain,and feeling lightheaded with heart beating fast at times when getting out of jail. Symptoms started last weekend after leaving jail over the weekend. Patient reports he is required to "pull time in jail" for 10 weekends. Patient started last weekend and reports he is not given his medications as instructed by PCP. Patient reports he has been receiving his medications late , too long apart while in jail and c/o dizziness, lightheadedness, and has chest pain with heart beating fast at times. Denies symptoms at this time. Patient reports he is required to go to [facility] jail every weekend Friday night until Sunday. Reports he is taking his medication at the correct times 2 times a day. Patient would like PCP to be aware of symptoms he has experienced in jail the past 2 weekends. No appt scheduled at this time. Patient also reports he does not drink the water in jail due to how it tastes and smells. Care advise given. Patient verbalized understanding of care advise and to call back or go to [facility] or ED if symptoms worsen.

## 2021-01-04 NOTE — Telephone Encounter (Signed)
FYI

## 2021-01-05 ENCOUNTER — Telehealth: Payer: Self-pay | Admitting: Critical Care Medicine

## 2021-01-05 NOTE — Telephone Encounter (Signed)
Patient called, states he has to do weekends in jail, and wondering if its ok how he is given medication while in there, its not the same as he normally takes it. He is not sure of the name of the medication. Please call back

## 2021-01-05 NOTE — Telephone Encounter (Signed)
Patient called to discuss his medications. He says that on weekends from Friday at 6pm-Sunday he has to report to jail for the weekend. While in the jail he says they are not giving his diltiazem BID as prescribed. He says he only took it once last Saturday and he started having chest pain and dizziness. He says he told them that the medication is for his A-Fib and to keep him from going into A-Fib, but he say they don't listen to him. He asked is there anything that Dr. Delford Field can do to let them know the diltiazem has to be given BID as prescribed on his bottle. He says he takes it on Friday and again on Sunday night when he's released, so Saturday is the only day he is taking it once a day. I advised maybe something in writing is what is needed to take to the jail. He says that may help. I advised I will send this to Dr. Delford Field and someone will call back with his recommendation.

## 2021-01-18 ENCOUNTER — Telehealth (INDEPENDENT_AMBULATORY_CARE_PROVIDER_SITE_OTHER): Payer: Self-pay

## 2021-01-18 ENCOUNTER — Telehealth: Payer: Self-pay | Admitting: Licensed Clinical Social Worker

## 2021-01-18 NOTE — Telephone Encounter (Signed)
Copied from CRM [id number]. Topic: General - Other >> Jan 18, 2021  9:32 AM Marylen Ponto wrote: Reason for CRM: Pt stated he has to do weekend lockup and the jail is not administering his Afib medication as Dr. Delford Field instructed him to take it. Pt stated sometimes he is giving the medication at 11 pm and when got out after this weekend he was not feeling well. Pt requests call back to advise what could be done to assist him with this matter. Cb# [phone]

## 2021-01-18 NOTE — Telephone Encounter (Signed)
Kevin Duncan, adding you, we need to determine who his probation officer is or how to communicate with the jail the importance of his daily medications and provide them his medication list.  He is likely missing his xarelto and his diltiazem for afib.   He will end up with a CVA and admit if this is not resolved with the jail system

## 2021-01-18 NOTE — Telephone Encounter (Signed)
Pt called my number twice again this afternoon. No messages left either time while I was on other calls. I called him back at [phone]. He states he dialed the wrong number, he states he has called [facility] clinic twice more. I shared with pt that an increased number of calls can slow down how well the clinic is able to respond to other patients and that I can see that the staff including Dr. Delford Field is working on his concerns currently. I shared that [facility] would be able to send a list of medications to jail nursing staff and confirmed he reports to [facility]. LCSW reached out to Robyne Peers,  [facility] at [facility] to see if assistance w/ contacting jail needed. I remain available to assist as needed and coordinate getting appropriate information to jail as needed.   Kevin Duncan, MSW, LCSW [facility]Vascular Care Navigation  [phone]

## 2021-01-18 NOTE — Telephone Encounter (Signed)
LCSW called pt to f/u on previously mailed documents for [employer]. Was able to reach him via telephone at [phone]. Re-introduced self, role, reason for call. He is unsure whether or not they received application. LCSW encouraged pt once he and his girlfriend have a day off to reach out to financial counselor [name] at [facility] to turn in completed application or if he did not receive application he will be able to request a new application and assistance.  Pt states that he has been trying to get in touch with PCP office as his cardiology medications he feels are not being managed by staff at the jail (he is required to report Friday-Sunday). He is looking for documentation to give to the jail staff on how his medications should be administered. Please see phone note from [facility], CMA, regarding this conversation as well as an additional phone note from [facility] clinic this morning. LCSW shared that I would reach out to Dr. Lupe Carney team to see if they have any recommendations but encouraged pt to give PCP time to respond back to inquiry. I have routed this to Belgium B., RN, after speaking with her briefly about pt concerns.   Kevin Duncan, MSW, LCSW [facility] Heart/Vascular Care Navigation  [phone]

## 2021-01-19 NOTE — Telephone Encounter (Signed)
Call placed to patient told him he needs to take his diltiazem long-acting and his Xarelto with him enough for 3 days I suspect he was getting short acting diltiazem from the jail pharmacy they now know he is on a long-acting diltiazem and is taking 240 mg twice a daily and he needs to take the Xarelto daily the patient pushed back a little on this but I told him this is the process he must follow and they are aware of his med list

## 2021-01-19 NOTE — Telephone Encounter (Signed)
Spoke with patient who states that Dr. Delford Field has taken care of his concerns regarding his medications. Advised patient that if he has any other issues to call back to the office. Patient verbalized understanding.

## 2021-01-21 ENCOUNTER — Telehealth: Payer: Self-pay | Admitting: Internal Medicine

## 2021-01-21 ENCOUNTER — Other Ambulatory Visit: Payer: Self-pay

## 2021-01-21 NOTE — Telephone Encounter (Signed)
PC received from on-call nurse.  They received call from in-take nurse Reggy Kathrynn Running at [facility]. Pt is detained there and the in-take nurse needs to verify his meds.  I then spoke with RN Kathrynn Running.  He stated that pt brought his meds in plastic baggy but not bottles with names on them.  He has a copy of pt's med list from a previous stay and wanted to verify meds and dosages.  I went through current list with him. Pt on Albuterol, Vit B12 1000 mcg, Cardizem CD 240 mg BID, Lasix PRN, Xarelto, Anoro, Cymbalta, SL Nitro PRN and Gabapentin.  RN tells me they do not give Gabapentin there but other meds will be give.

## 2021-02-01 ENCOUNTER — Other Ambulatory Visit: Payer: Self-pay

## 2021-02-02 ENCOUNTER — Other Ambulatory Visit: Payer: Self-pay

## 2021-02-04 ENCOUNTER — Other Ambulatory Visit: Payer: Self-pay

## 2021-02-07 ENCOUNTER — Ambulatory Visit: Payer: Self-pay | Admitting: Critical Care Medicine

## 2021-03-01 ENCOUNTER — Other Ambulatory Visit: Payer: Self-pay

## 2021-03-01 ENCOUNTER — Other Ambulatory Visit: Payer: Self-pay | Admitting: Critical Care Medicine

## 2021-03-01 DIAGNOSIS — R600 Localized edema: Secondary | ICD-10-CM

## 2021-03-01 DIAGNOSIS — R609 Edema, unspecified: Secondary | ICD-10-CM

## 2021-03-01 DIAGNOSIS — R062 Wheezing: Secondary | ICD-10-CM

## 2021-03-01 MED ORDER — FUROSEMIDE 20 MG PO TABS
ORAL_TABLET | ORAL | 3 refills | Status: AC
  Filled 2021-03-01: qty 30, 30d supply, fill #0
  Filled 2021-05-02: qty 30, 30d supply, fill #1
  Filled 2021-06-10: qty 30, 30d supply, fill #2

## 2021-03-01 MED ORDER — ALBUTEROL SULFATE HFA 108 (90 BASE) MCG/ACT IN AERS
2.0000 | INHALATION_SPRAY | Freq: Four times a day (QID) | RESPIRATORY_TRACT | 1 refills | Status: DC | PRN
  Filled 2021-03-01: qty 18, 25d supply, fill #0
  Filled 2021-05-02: qty 18, 25d supply, fill #1

## 2021-03-04 ENCOUNTER — Other Ambulatory Visit: Payer: Self-pay

## 2021-03-21 ENCOUNTER — Other Ambulatory Visit: Payer: Self-pay

## 2021-05-02 ENCOUNTER — Other Ambulatory Visit: Payer: Self-pay

## 2021-05-26 NOTE — Progress Notes (Deleted)
Cardiology Office Note   Date:  05/26/2021   ID:  Kevin Duncan, DOB 07-30-1970, MRN [id number]  PCP:  Elsie Stain, MD  Cardiologist: Dr.  Margaretann Loveless  No chief complaint on file.    History of Present Illness: Kevin Duncan is a 50 y.o. 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  Heart rate is maintained on diltiazem with anticoagulation using Xarelto 20 mg daily.  He has been offered cardioversion but due to lack of insurance/cost prohibitive  he has refused the procedure.  Last seen in the office on 12/14/2020 at that time his blood pressure was stable on diltiazem along with good heart rate control.  He had discontinued valsartan due to lightheadedness.  He was compliant with anticoagulation therapy.  He continued to have some dyspnea on exertion with mixed etiology as result of COPD, and atrial fibrillation.  Although he did not have any significant chest discomfort he did have risk factors for CAD.  Stress test was offered but he would like to defer this until he becomes more symptomatic concerning chest discomfort or shortness of breath due to lack of insurance.  He was given prescription for nitroglycerin and advised to go to ED for any significant chest discomfort or shortness of breath or rapid heart rhythm.   Past Medical History:  Diagnosis Date   Atrial fibrillation (HCC)    COPD (chronic obstructive pulmonary disease) (HCC)    Hypertension    Prediabetes     Past Surgical History:  Procedure Laterality Date   THUMB FUSION Bilateral      Current Outpatient Medications  Medication Sig Dispense Refill   albuterol (VENTOLIN HFA) 108 (90 Base) MCG/ACT inhaler Inhale 2 puffs into the lungs every 6 (six) hours as needed for wheezing or shortness of breath. 18 g  1   Blood Glucose Monitoring Suppl (TRUE METRIX METER) w/Device KIT 1 kit by Does not apply route daily. Use to check fasting blood sugar daily. 1 kit 0   diltiazem (CARDIZEM CD) 120 MG 24 hr capsule Take 2 capsules (240 mg total) by mouth in the morning and at bedtime. 240 capsule 1   DULoxetine (CYMBALTA) 60 MG capsule Take 1 capsule (60 mg total) by mouth daily. 90 capsule 1   furosemide (LASIX) 20 MG tablet TAKE 1 TABLET BY MOUTH DAILY FOR 3 DAYS THEN AS NEEDED FOR LEG SWELLING 30 tablet 3   gabapentin (NEURONTIN) 300 MG capsule TAKE 2 CAPSULES (600 MG TOTAL) BY MOUTH 3 (THREE) TIMES DAILY. 180 capsule 3   glucose blood test strip Use to check fasting blood sugar daily. 100 each 2   nicotine polacrilex (NICORETTE MINI) 4 MG lozenge Use three times a day to quit smoking 100 tablet 4   nitroGLYCERIN (NITROSTAT) 0.4 MG SL tablet Place 1 tablet (0.4 mg total) under the tongue every 5 (five) minutes as needed for chest pain. 25 tablet 3   rivaroxaban (XARELTO) 20 MG TABS tablet Take 1 tablet (20 mg total) by mouth daily with supper. 30 tablet 11   TRUEplus Lancets 28G MISC Use to check fasting blood sugar daily. 100 each 2   vitamin B-12 (CYANOCOBALAMIN) 1000 MCG tablet Take 1,000 mcg by mouth daily. (Patient not taking: Reported on 12/14/2020)     No current facility-administered medications for this visit.  Allergies:   Atorvastatin, Folic acid, Hydrocodone, and Trazodone and nefazodone    Social History:  The patient  reports that he has been smoking cigarettes. He has been smoking an average of 1.5 packs per day. He has never used smokeless tobacco. He reports that he does not currently use alcohol after a past usage of about 3.0 standard drinks per week. He reports that he does not use drugs.   Family History:  The patient's family history includes Diabetes in his maternal grandmother and mother; Heart disease in his brother, father, and paternal grandmother.    ROS: All other systems  are reviewed and negative. Unless otherwise mentioned in H&P    PHYSICAL EXAM: VS:  There were no vitals taken for this visit. , BMI There is no height or weight on file to calculate BMI. GEN: Well nourished, well developed, in no acute distress HEENT: normal Neck: no JVD, carotid bruits, or masses Cardiac: ***RRR; no murmurs, rubs, or gallops,no edema  Respiratory:  Clear to auscultation bilaterally, normal work of breathing GI: soft, nontender, nondistended, + BS MS: no deformity or atrophy Skin: warm and dry, no rash Neuro:  Strength and sensation are intact Psych: euthymic mood, full affect   EKG:  EKG {ACTION; IS/IS [id number]} ordered today. The ekg ordered today demonstrates ***   Recent Labs: 09/01/2020: B Natriuretic Peptide 427.4 10/04/2020: ALT 21; BUN 10; Creatinine, Ser 0.79; Hemoglobin 15.2; Platelets 242; Potassium 4.4; Sodium 143    Lipid Panel No results found for: CHOL, TRIG, HDL, CHOLHDL, VLDL, LDLCALC, LDLDIRECT    Wt Readings from Last 3 Encounters:  12/14/20 226 lb 3.2 oz (102.6 kg)  09/04/20 232 lb 14.4 oz (105.6 kg)  06/09/20 243 lb (110.2 kg)      Other studies Reviewed: Echo 09/01/2020:  1. Left ventricular ejection fraction, by estimation, is 55 to 60%. The  left ventricle has normal function. The left ventricle has no regional  wall motion abnormalities. Left ventricular diastolic function could not  be evaluated.   2. Right ventricular systolic function is normal. The right ventricular  size is normal. Tricuspid regurgitation signal is inadequate for assessing  PA pressure.   3. Left atrial size was mildly dilated.   4. The mitral valve is grossly normal. Trivial mitral valve  regurgitation. No evidence of mitral stenosis.   5. The aortic valve is tricuspid. Aortic valve regurgitation is not  visualized. No aortic stenosis is present.   Comparison(s): No significant change from prior study.   Echo 12/18/2019:  1. Left ventricular  ejection fraction, by estimation, is 50%. The left  ventricle has low normal function. The left ventricle has no regional wall  motion abnormalities. There is moderate left ventricular hypertrophy. Left  ventricular diastolic parameters   are indeterminate.   2. Right ventricular systolic function is normal. The right ventricular  size is normal. Tricuspid regurgitation signal is inadequate for assessing  PA pressure.   3. Left atrial size was mildly dilated.   4. Right atrial size was mildly dilated.   5. The mitral valve is normal in structure. Trivial mitral valve  regurgitation. No evidence of mitral stenosis.   6. The aortic valve is normal in structure. Aortic valve regurgitation is  not visualized. No aortic stenosis is present.   7. Aortic dilatation noted. There is borderline dilatation of the aortic  root measuring 39 mm.   8. The inferior vena cava is normal in size with greater than 50%  respiratory variability, suggesting right  atrial pressure of 3 mmHg.   ASSESSMENT AND PLAN:  1.  ***   Current medicines are reviewed at length with the patient today.  I have spent *** dedicated to the care of this patient on the date of this encounter to include pre-visit review of records, assessment, management and diagnostic testing,with shared decision making.  Labs/ tests ordered today include: *** Phill Myron. West Pugh, ANP, AACC   05/26/2021 11:04 AM    [place] [address] Office [phone] Fax [phone]  Notice: This dictation was prepared with Dragon dictation along with smaller phrase technology. Any transcriptional errors that result from this process are unintentional and may not be corrected upon review.

## 2021-05-27 ENCOUNTER — Ambulatory Visit: Payer: Self-pay | Admitting: Adult Health

## 2021-06-03 ENCOUNTER — Other Ambulatory Visit: Payer: Self-pay

## 2021-06-10 ENCOUNTER — Other Ambulatory Visit: Payer: Self-pay

## 2021-06-10 ENCOUNTER — Other Ambulatory Visit: Payer: Self-pay | Admitting: Pharmacist

## 2021-06-10 ENCOUNTER — Other Ambulatory Visit: Payer: Self-pay | Admitting: Critical Care Medicine

## 2021-06-10 DIAGNOSIS — R200 Anesthesia of skin: Secondary | ICD-10-CM

## 2021-06-10 DIAGNOSIS — R062 Wheezing: Secondary | ICD-10-CM

## 2021-06-10 DIAGNOSIS — I4811 Longstanding persistent atrial fibrillation: Secondary | ICD-10-CM

## 2021-06-10 MED ORDER — ALBUTEROL SULFATE HFA 108 (90 BASE) MCG/ACT IN AERS
2.0000 | INHALATION_SPRAY | Freq: Four times a day (QID) | RESPIRATORY_TRACT | 0 refills | Status: AC | PRN
  Filled 2021-06-10: qty 18, 25d supply, fill #0

## 2021-06-10 MED ORDER — GABAPENTIN 300 MG PO CAPS
ORAL_CAPSULE | Freq: Three times a day (TID) | ORAL | 3 refills | Status: AC
  Filled 2021-06-10: qty 180, 30d supply, fill #0

## 2021-06-10 MED ORDER — DILTIAZEM HCL ER COATED BEADS 120 MG PO CP24
240.0000 mg | ORAL_CAPSULE | Freq: Two times a day (BID) | ORAL | 0 refills | Status: AC
  Filled 2021-06-10: qty 120, 30d supply, fill #0

## 2021-06-10 MED ORDER — RIVAROXABAN 20 MG PO TABS
20.0000 mg | ORAL_TABLET | Freq: Every day | ORAL | 0 refills | Status: AC
  Filled 2021-06-10: qty 30, 30d supply, fill #0

## 2021-06-17 ENCOUNTER — Other Ambulatory Visit: Payer: Self-pay

## 2021-07-13 ENCOUNTER — Telehealth: Payer: Self-pay | Admitting: Critical Care Medicine

## 2021-07-13 ENCOUNTER — Ambulatory Visit: Payer: Self-pay | Attending: Critical Care Medicine | Admitting: Critical Care Medicine

## 2021-07-13 ENCOUNTER — Encounter: Payer: Self-pay | Admitting: Critical Care Medicine

## 2021-07-13 ENCOUNTER — Other Ambulatory Visit: Payer: Self-pay

## 2021-07-13 DIAGNOSIS — I4811 Longstanding persistent atrial fibrillation: Secondary | ICD-10-CM

## 2021-07-13 NOTE — Telephone Encounter (Signed)
Called  [facility] and let them know that it was signed

## 2021-07-13 NOTE — Telephone Encounter (Signed)
I have signed the death certificate let them know

## 2021-07-13 NOTE — Telephone Encounter (Signed)
fyi

## 2021-07-13 NOTE — Telephone Encounter (Signed)
Copied from [place] [id number]. Topic: General - Deceased Patient >> 07/13/2021  3:19 PM Erick Blinks wrote: Reason for CRM: Called to report that a death certificate was submitted for PCP to sign on [facility]CDave online   Jarrett Soho with [facility] called  Best contact: [phone]  Route to department's PEC Pool.

## 2021-07-13 NOTE — Progress Notes (Signed)
Patient ID: Kevin Duncan, male   DOB: Nov 02, 1970, 51 y.o.   MRN: [id number] This patient was on my schedule and at the same time that I attempted to engage with this patient I received an email from the [facility] that the patient had died suddenly on 2022-07-19 and I was to sign a death certificate  I called [facility] and they indicated they did have the body and needed the death certificate signed they did not have context over the patient's death  I then called the patient's girlfriend her name is Judeen Hammans and she informed me that she was living with him in his mobile home he had not been in good health for about a week was drinking alcohol and smoking excessively she left in the afternoon on 19-Jul-2022 for about 45 minutes he was in his bedroom she came back at 5:30 PM and found him completely blue dead and not breathing.  EMS was called they pronounced him they called EMS who declined the case.  Our office was not notified of these events.  I did not see any evidence he was taken to [facility] or any other medical facility for assessment.  I did fill out the death summary and given his history of atrial fibrillation COPD heavy alcohol use hypertension diabetes I indicated that he died likely from coronary atherosclerosis with a cardiac arrest complicated by hypertension smoking alcohol use and chronic obstructive lung disease these were natural cause death and I further indicated that an autopsy was not done as the case was declined by the medical examiner to have an autopsy   I expressed my sincere condolences to the patient's girlfriend and told her to relay my condolences to his direct family

## 2021-07-13 NOTE — Telephone Encounter (Signed)
Copied from [place] [id number]. Topic: General - Deceased Patient >> July 21, 2021  3:19 PM Erick Blinks wrote: Reason for CRM: Called to report that a death certificate was submitted for PCP to sign on [facility] online   Jarrett Soho with [facility] called  Best contact: [phone]  Route to department's PEC Pool.

## 2021-07-20 DEATH — deceased

## 2021-12-09 ENCOUNTER — Other Ambulatory Visit: Payer: Self-pay

## 2021-12-16 ENCOUNTER — Other Ambulatory Visit: Payer: Self-pay
